# Patient Record
Sex: Female | Born: 1945
Health system: Southern US, Community
[De-identification: ages and names within clinical notes are randomized; demographics above are authoritative.]

## PROBLEM LIST (undated history)

## (undated) DIAGNOSIS — Z8711 Personal history of peptic ulcer disease: Secondary | ICD-10-CM

## (undated) DIAGNOSIS — M1711 Unilateral primary osteoarthritis, right knee: Secondary | ICD-10-CM

## (undated) DIAGNOSIS — R3915 Urgency of urination: Secondary | ICD-10-CM

## (undated) DIAGNOSIS — H409 Unspecified glaucoma: Secondary | ICD-10-CM

## (undated) DIAGNOSIS — Z86010 Personal history of colonic polyps: Secondary | ICD-10-CM

## (undated) DIAGNOSIS — Z8719 Personal history of other diseases of the digestive system: Secondary | ICD-10-CM

## (undated) DIAGNOSIS — K5790 Diverticulosis of intestine, part unspecified, without perforation or abscess without bleeding: Secondary | ICD-10-CM

## (undated) DIAGNOSIS — E785 Hyperlipidemia, unspecified: Secondary | ICD-10-CM

## (undated) DIAGNOSIS — E119 Type 2 diabetes mellitus without complications: Secondary | ICD-10-CM

## (undated) DIAGNOSIS — J45909 Unspecified asthma, uncomplicated: Secondary | ICD-10-CM

## (undated) DIAGNOSIS — H3530 Unspecified macular degeneration: Secondary | ICD-10-CM

## (undated) DIAGNOSIS — M2540 Effusion, unspecified joint: Secondary | ICD-10-CM

## (undated) DIAGNOSIS — E871 Hypo-osmolality and hyponatremia: Principal | ICD-10-CM

## (undated) DIAGNOSIS — C801 Malignant (primary) neoplasm, unspecified: Secondary | ICD-10-CM

## (undated) DIAGNOSIS — G4730 Sleep apnea, unspecified: Secondary | ICD-10-CM

## (undated) DIAGNOSIS — M2550 Pain in unspecified joint: Secondary | ICD-10-CM

## (undated) DIAGNOSIS — I10 Essential (primary) hypertension: Secondary | ICD-10-CM

## (undated) HISTORY — PX: JOINT REPLACEMENT: SHX530

## (undated) HISTORY — PX: COLONOSCOPY: SHX174

## (undated) HISTORY — DX: Type 2 diabetes mellitus without complications: E11.9

## (undated) HISTORY — PX: TUBAL LIGATION: SHX77

## (undated) HISTORY — PX: KNEE ARTHROSCOPY: SUR90

## (undated) HISTORY — DX: Hyperlipidemia, unspecified: E78.5

## (undated) HISTORY — DX: Sleep apnea, unspecified: G47.30

## (undated) HISTORY — DX: Essential (primary) hypertension: I10

## (undated) HISTORY — PX: OTHER SURGICAL HISTORY: SHX169

## (undated) HISTORY — PX: ESOPHAGOGASTRODUODENOSCOPY: SHX1529

## (undated) HISTORY — DX: Hypo-osmolality and hyponatremia: E87.1

## (undated) HISTORY — DX: Malignant (primary) neoplasm, unspecified: C80.1

---

## 1999-11-09 ENCOUNTER — Other Ambulatory Visit: Admission: RE | Admit: 1999-11-09 | Discharge: 1999-11-09 | Payer: Self-pay | Admitting: Gynecology

## 1999-11-10 ENCOUNTER — Other Ambulatory Visit: Admission: RE | Admit: 1999-11-10 | Discharge: 1999-11-10 | Payer: Self-pay | Admitting: Gynecology

## 1999-11-16 ENCOUNTER — Encounter: Payer: Self-pay | Admitting: Internal Medicine

## 1999-11-16 ENCOUNTER — Encounter: Admission: RE | Admit: 1999-11-16 | Discharge: 1999-11-16 | Payer: Self-pay | Admitting: Internal Medicine

## 2001-01-07 ENCOUNTER — Encounter: Admission: RE | Admit: 2001-01-07 | Discharge: 2001-01-07 | Payer: Self-pay | Admitting: Internal Medicine

## 2001-01-07 ENCOUNTER — Encounter: Payer: Self-pay | Admitting: Internal Medicine

## 2001-07-11 ENCOUNTER — Encounter: Admission: RE | Admit: 2001-07-11 | Discharge: 2001-07-11 | Payer: Self-pay | Admitting: Internal Medicine

## 2001-07-11 ENCOUNTER — Encounter: Payer: Self-pay | Admitting: Internal Medicine

## 2001-12-10 ENCOUNTER — Encounter (INDEPENDENT_AMBULATORY_CARE_PROVIDER_SITE_OTHER): Payer: Self-pay | Admitting: *Deleted

## 2001-12-10 ENCOUNTER — Ambulatory Visit (HOSPITAL_COMMUNITY): Admission: RE | Admit: 2001-12-10 | Discharge: 2001-12-10 | Payer: Self-pay | Admitting: Gastroenterology

## 2002-03-12 ENCOUNTER — Encounter: Payer: Self-pay | Admitting: Internal Medicine

## 2002-03-12 ENCOUNTER — Encounter: Admission: RE | Admit: 2002-03-12 | Discharge: 2002-03-12 | Payer: Self-pay | Admitting: Internal Medicine

## 2003-04-16 ENCOUNTER — Encounter: Payer: Self-pay | Admitting: Internal Medicine

## 2003-04-16 ENCOUNTER — Encounter: Admission: RE | Admit: 2003-04-16 | Discharge: 2003-04-16 | Payer: Self-pay | Admitting: Internal Medicine

## 2003-12-28 ENCOUNTER — Ambulatory Visit (HOSPITAL_COMMUNITY): Admission: RE | Admit: 2003-12-28 | Discharge: 2003-12-28 | Payer: Self-pay | Admitting: Orthopedic Surgery

## 2004-03-15 ENCOUNTER — Other Ambulatory Visit: Admission: RE | Admit: 2004-03-15 | Discharge: 2004-03-15 | Payer: Self-pay | Admitting: Internal Medicine

## 2004-08-22 ENCOUNTER — Encounter: Admission: RE | Admit: 2004-08-22 | Discharge: 2004-08-22 | Payer: Self-pay | Admitting: Internal Medicine

## 2005-02-21 ENCOUNTER — Ambulatory Visit (HOSPITAL_COMMUNITY): Admission: RE | Admit: 2005-02-21 | Discharge: 2005-02-21 | Payer: Self-pay | Admitting: Gastroenterology

## 2005-08-31 ENCOUNTER — Encounter: Admission: RE | Admit: 2005-08-31 | Discharge: 2005-08-31 | Payer: Self-pay | Admitting: Orthopedic Surgery

## 2005-09-18 ENCOUNTER — Encounter: Admission: RE | Admit: 2005-09-18 | Discharge: 2005-09-18 | Payer: Self-pay | Admitting: Internal Medicine

## 2005-11-06 ENCOUNTER — Ambulatory Visit (HOSPITAL_COMMUNITY): Admission: RE | Admit: 2005-11-06 | Discharge: 2005-11-06 | Payer: Self-pay | Admitting: Orthopedic Surgery

## 2005-11-06 ENCOUNTER — Ambulatory Visit (HOSPITAL_BASED_OUTPATIENT_CLINIC_OR_DEPARTMENT_OTHER): Admission: RE | Admit: 2005-11-06 | Discharge: 2005-11-06 | Payer: Self-pay | Admitting: Orthopedic Surgery

## 2006-05-15 ENCOUNTER — Other Ambulatory Visit: Admission: RE | Admit: 2006-05-15 | Discharge: 2006-05-15 | Payer: Self-pay | Admitting: Internal Medicine

## 2006-10-26 ENCOUNTER — Encounter: Admission: RE | Admit: 2006-10-26 | Discharge: 2006-10-26 | Payer: Self-pay | Admitting: Internal Medicine

## 2007-11-25 ENCOUNTER — Encounter: Admission: RE | Admit: 2007-11-25 | Discharge: 2007-11-25 | Payer: Self-pay | Admitting: Internal Medicine

## 2009-02-01 ENCOUNTER — Encounter: Admission: RE | Admit: 2009-02-01 | Discharge: 2009-02-01 | Payer: Self-pay | Admitting: Internal Medicine

## 2009-08-02 ENCOUNTER — Other Ambulatory Visit: Admission: RE | Admit: 2009-08-02 | Discharge: 2009-08-02 | Payer: Self-pay | Admitting: Family Medicine

## 2010-02-28 ENCOUNTER — Encounter: Admission: RE | Admit: 2010-02-28 | Discharge: 2010-02-28 | Payer: Self-pay | Admitting: Internal Medicine

## 2011-04-03 ENCOUNTER — Other Ambulatory Visit: Payer: Self-pay | Admitting: Internal Medicine

## 2011-04-03 DIAGNOSIS — Z1231 Encounter for screening mammogram for malignant neoplasm of breast: Secondary | ICD-10-CM

## 2011-04-05 ENCOUNTER — Ambulatory Visit
Admission: RE | Admit: 2011-04-05 | Discharge: 2011-04-05 | Disposition: A | Discharge status: Home / Self Care | Payer: Medicare Other | Source: Ambulatory Visit | Attending: Internal Medicine | Admitting: Internal Medicine

## 2011-04-05 DIAGNOSIS — Z1231 Encounter for screening mammogram for malignant neoplasm of breast: Secondary | ICD-10-CM

## 2011-05-12 NOTE — Op Note (Signed)
NAME:  Natalie Riddle, Natalie Riddle        ACCOUNT NO.:  [id number]   MEDICAL RECORD NO.:  [id number]          PATIENT TYPE:  AMB   LOCATION:  DSC                          FACILITY:  [facility]   PHYSICIAN:  Robert A. Thurston Hole, M.D. DATE OF BIRTH:  Jan 02, 1946   DATE OF PROCEDURE:  11/06/2005  DATE OF DISCHARGE:                                 OPERATIVE REPORT   PREOPERATIVE DIAGNOSIS:  Right knee medial and lateral meniscus tears with  chondromalacia and synovitis.   POSTOPERATIVE DIAGNOSIS:  Right knee medial and lateral meniscus tears with  chondromalacia and synovitis.   PROCEDURE:  1.  Right knee exam under anesthesia followed by arthroscopic partial medial      and lateral meniscectomies.  2.  Right knee chondroplasty with partial synovectomy.   SURGEON:  Elana Alm. Thurston Hole, M.D.   ANESTHESIA:  General.   OPERATIVE TIME:  30 minutes.   COMPLICATIONS:  None.   INDICATIONS FOR PROCEDURE:  Natalie Riddle is a 65 year old woman who has  had significant right knee pain for the past 3-4 months increasing in nature  with exam and MRI documenting medial and lateral meniscus tears with  chondromalacia and synovitis.  She has failed conservative care and is now  to undergo arthroscopy.   DESCRIPTION OF PROCEDURE:  Natalie Riddle was brought to the operating room  on November 06, 2005, after a knee block had been placed in the holding room  by anesthesia.  She was placed on the operating table in supine position.  Her right knee was examined.  Range of motion from 0 to 125 degrees, 1-2+  crepitation, stable to ligamentous exam with normal patellar tracking.  The  right leg was prepped with DuraPrep and draped using sterile technique.  Originally through an anterolateral portal, the arthroscope with a pump  attachment was placed and through an anteromedial portal, an arthroscopic  probe was placed.  On initial inspection of the medial compartment, she was  found to have 75-80% grade 3  chondromalacia which was debrided.  Medial  meniscus tear posterior medial horn of which 50% was resected back to a  stable rim.  The intercondylar notch was inspected and the anterior and  posterior cruciate ligaments were normal.  The lateral compartment was  inspected, 30-40% grade 3 chondromalacia which was debrided.  Lateral  meniscus tear posterolateral and anterior horn of which 30% was resected  back to a stable rim.  The patellofemoral joint showed 75% grade 3  chondromalacia which was debrided.  The patella tracked normally.  Significant synovitis in the medial and lateral gutters were debrided,  otherwise, they were free of pathology.  After this was done, it was felt  that all pathology had been satisfactorily addressed.  The instruments were  removed.  The portals were closed with a 3-0 nylon suture and injected with  0.25% Marcaine with epinephrine and 4 mg morphine.  Sterile dressings were  applied.  The patient was awakened and taken to the recovery room in stable  condition.   FOLLOW UP:  Natalie Riddle will be followed as an outpatient on [employer], Darvocet, and Celebrex.  See her  back in the office in one week for  sutures out and follow up.      Robert A. Thurston Hole, M.D.  Electronically Signed     RAW/MEDQ  D:  11/06/2005  T:  11/06/2005  Job:  [id number]

## 2011-12-27 DIAGNOSIS — IMO0002 Reserved for concepts with insufficient information to code with codable children: Secondary | ICD-10-CM | POA: Diagnosis not present

## 2011-12-27 DIAGNOSIS — M7660 Achilles tendinitis, unspecified leg: Secondary | ICD-10-CM | POA: Diagnosis not present

## 2011-12-28 DIAGNOSIS — M1710 Unilateral primary osteoarthritis, unspecified knee: Secondary | ICD-10-CM | POA: Diagnosis not present

## 2012-01-18 DIAGNOSIS — IMO0002 Reserved for concepts with insufficient information to code with codable children: Secondary | ICD-10-CM | POA: Diagnosis not present

## 2012-01-18 DIAGNOSIS — M1710 Unilateral primary osteoarthritis, unspecified knee: Secondary | ICD-10-CM | POA: Diagnosis not present

## 2012-01-18 DIAGNOSIS — M7660 Achilles tendinitis, unspecified leg: Secondary | ICD-10-CM | POA: Diagnosis not present

## 2012-01-25 DIAGNOSIS — M1710 Unilateral primary osteoarthritis, unspecified knee: Secondary | ICD-10-CM | POA: Diagnosis not present

## 2012-01-26 DIAGNOSIS — IMO0002 Reserved for concepts with insufficient information to code with codable children: Secondary | ICD-10-CM | POA: Diagnosis not present

## 2012-01-26 DIAGNOSIS — M7660 Achilles tendinitis, unspecified leg: Secondary | ICD-10-CM | POA: Diagnosis not present

## 2012-01-29 DIAGNOSIS — J40 Bronchitis, not specified as acute or chronic: Secondary | ICD-10-CM | POA: Diagnosis not present

## 2012-01-29 DIAGNOSIS — E785 Hyperlipidemia, unspecified: Secondary | ICD-10-CM | POA: Diagnosis not present

## 2012-01-29 DIAGNOSIS — I10 Essential (primary) hypertension: Secondary | ICD-10-CM | POA: Diagnosis not present

## 2012-01-29 DIAGNOSIS — R7301 Impaired fasting glucose: Secondary | ICD-10-CM | POA: Diagnosis not present

## 2012-01-31 DIAGNOSIS — M7660 Achilles tendinitis, unspecified leg: Secondary | ICD-10-CM | POA: Diagnosis not present

## 2012-01-31 DIAGNOSIS — IMO0002 Reserved for concepts with insufficient information to code with codable children: Secondary | ICD-10-CM | POA: Diagnosis not present

## 2012-02-01 DIAGNOSIS — M1710 Unilateral primary osteoarthritis, unspecified knee: Secondary | ICD-10-CM | POA: Diagnosis not present

## 2012-02-05 DIAGNOSIS — IMO0002 Reserved for concepts with insufficient information to code with codable children: Secondary | ICD-10-CM | POA: Diagnosis not present

## 2012-02-05 DIAGNOSIS — M7660 Achilles tendinitis, unspecified leg: Secondary | ICD-10-CM | POA: Diagnosis not present

## 2012-02-07 DIAGNOSIS — M7660 Achilles tendinitis, unspecified leg: Secondary | ICD-10-CM | POA: Diagnosis not present

## 2012-02-07 DIAGNOSIS — IMO0002 Reserved for concepts with insufficient information to code with codable children: Secondary | ICD-10-CM | POA: Diagnosis not present

## 2012-02-13 DIAGNOSIS — IMO0002 Reserved for concepts with insufficient information to code with codable children: Secondary | ICD-10-CM | POA: Diagnosis not present

## 2012-02-13 DIAGNOSIS — M7660 Achilles tendinitis, unspecified leg: Secondary | ICD-10-CM | POA: Diagnosis not present

## 2012-02-15 DIAGNOSIS — M7660 Achilles tendinitis, unspecified leg: Secondary | ICD-10-CM | POA: Diagnosis not present

## 2012-02-15 DIAGNOSIS — IMO0002 Reserved for concepts with insufficient information to code with codable children: Secondary | ICD-10-CM | POA: Diagnosis not present

## 2012-02-19 DIAGNOSIS — IMO0002 Reserved for concepts with insufficient information to code with codable children: Secondary | ICD-10-CM | POA: Diagnosis not present

## 2012-02-19 DIAGNOSIS — M7660 Achilles tendinitis, unspecified leg: Secondary | ICD-10-CM | POA: Diagnosis not present

## 2012-02-27 DIAGNOSIS — IMO0002 Reserved for concepts with insufficient information to code with codable children: Secondary | ICD-10-CM | POA: Diagnosis not present

## 2012-02-27 DIAGNOSIS — M7660 Achilles tendinitis, unspecified leg: Secondary | ICD-10-CM | POA: Diagnosis not present

## 2012-02-28 DIAGNOSIS — IMO0002 Reserved for concepts with insufficient information to code with codable children: Secondary | ICD-10-CM | POA: Diagnosis not present

## 2012-02-28 DIAGNOSIS — M7660 Achilles tendinitis, unspecified leg: Secondary | ICD-10-CM | POA: Diagnosis not present

## 2012-03-05 DIAGNOSIS — M7660 Achilles tendinitis, unspecified leg: Secondary | ICD-10-CM | POA: Diagnosis not present

## 2012-03-05 DIAGNOSIS — IMO0002 Reserved for concepts with insufficient information to code with codable children: Secondary | ICD-10-CM | POA: Diagnosis not present

## 2012-03-11 DIAGNOSIS — IMO0002 Reserved for concepts with insufficient information to code with codable children: Secondary | ICD-10-CM | POA: Diagnosis not present

## 2012-03-11 DIAGNOSIS — M7660 Achilles tendinitis, unspecified leg: Secondary | ICD-10-CM | POA: Diagnosis not present

## 2012-03-26 DIAGNOSIS — M25569 Pain in unspecified knee: Secondary | ICD-10-CM | POA: Diagnosis not present

## 2012-03-26 DIAGNOSIS — M1710 Unilateral primary osteoarthritis, unspecified knee: Secondary | ICD-10-CM | POA: Diagnosis not present

## 2012-03-30 DIAGNOSIS — IMO0002 Reserved for concepts with insufficient information to code with codable children: Secondary | ICD-10-CM | POA: Diagnosis not present

## 2012-03-30 DIAGNOSIS — M7660 Achilles tendinitis, unspecified leg: Secondary | ICD-10-CM | POA: Diagnosis not present

## 2012-04-30 DIAGNOSIS — R05 Cough: Secondary | ICD-10-CM | POA: Diagnosis not present

## 2012-04-30 DIAGNOSIS — J209 Acute bronchitis, unspecified: Secondary | ICD-10-CM | POA: Diagnosis not present

## 2012-05-13 DIAGNOSIS — H43819 Vitreous degeneration, unspecified eye: Secondary | ICD-10-CM | POA: Diagnosis not present

## 2012-05-13 DIAGNOSIS — H409 Unspecified glaucoma: Secondary | ICD-10-CM | POA: Diagnosis not present

## 2012-05-13 DIAGNOSIS — H3530 Unspecified macular degeneration: Secondary | ICD-10-CM | POA: Diagnosis not present

## 2012-05-13 DIAGNOSIS — H4011X0 Primary open-angle glaucoma, stage unspecified: Secondary | ICD-10-CM | POA: Diagnosis not present

## 2012-07-05 ENCOUNTER — Other Ambulatory Visit: Payer: Self-pay | Admitting: Internal Medicine

## 2012-07-05 DIAGNOSIS — Z1231 Encounter for screening mammogram for malignant neoplasm of breast: Secondary | ICD-10-CM

## 2012-07-09 ENCOUNTER — Ambulatory Visit
Admission: RE | Admit: 2012-07-09 | Discharge: 2012-07-09 | Disposition: A | Discharge status: Home / Self Care | Payer: Medicare Other | Source: Ambulatory Visit | Attending: Internal Medicine | Admitting: Internal Medicine

## 2012-07-09 DIAGNOSIS — Z1231 Encounter for screening mammogram for malignant neoplasm of breast: Secondary | ICD-10-CM | POA: Diagnosis not present

## 2012-07-11 DIAGNOSIS — M542 Cervicalgia: Secondary | ICD-10-CM | POA: Diagnosis not present

## 2012-07-11 DIAGNOSIS — M1710 Unilateral primary osteoarthritis, unspecified knee: Secondary | ICD-10-CM | POA: Diagnosis not present

## 2012-07-29 DIAGNOSIS — Z0000 Encounter for general adult medical examination without abnormal findings: Secondary | ICD-10-CM | POA: Diagnosis not present

## 2012-08-14 DIAGNOSIS — R7309 Other abnormal glucose: Secondary | ICD-10-CM | POA: Diagnosis not present

## 2012-08-27 DIAGNOSIS — IMO0002 Reserved for concepts with insufficient information to code with codable children: Secondary | ICD-10-CM | POA: Diagnosis not present

## 2012-08-27 DIAGNOSIS — M7660 Achilles tendinitis, unspecified leg: Secondary | ICD-10-CM | POA: Diagnosis not present

## 2012-08-30 DIAGNOSIS — M7660 Achilles tendinitis, unspecified leg: Secondary | ICD-10-CM | POA: Diagnosis not present

## 2012-08-30 DIAGNOSIS — IMO0002 Reserved for concepts with insufficient information to code with codable children: Secondary | ICD-10-CM | POA: Diagnosis not present

## 2012-09-11 DIAGNOSIS — H259 Unspecified age-related cataract: Secondary | ICD-10-CM | POA: Diagnosis not present

## 2012-09-11 DIAGNOSIS — H409 Unspecified glaucoma: Secondary | ICD-10-CM | POA: Diagnosis not present

## 2012-09-11 DIAGNOSIS — H4011X0 Primary open-angle glaucoma, stage unspecified: Secondary | ICD-10-CM | POA: Diagnosis not present

## 2012-10-09 DIAGNOSIS — M25569 Pain in unspecified knee: Secondary | ICD-10-CM | POA: Diagnosis not present

## 2012-10-09 DIAGNOSIS — M542 Cervicalgia: Secondary | ICD-10-CM | POA: Diagnosis not present

## 2012-10-09 DIAGNOSIS — M1710 Unilateral primary osteoarthritis, unspecified knee: Secondary | ICD-10-CM | POA: Diagnosis not present

## 2012-10-22 DIAGNOSIS — Z23 Encounter for immunization: Secondary | ICD-10-CM | POA: Diagnosis not present

## 2012-10-23 DIAGNOSIS — M1710 Unilateral primary osteoarthritis, unspecified knee: Secondary | ICD-10-CM | POA: Diagnosis not present

## 2012-11-04 DIAGNOSIS — M1710 Unilateral primary osteoarthritis, unspecified knee: Secondary | ICD-10-CM | POA: Diagnosis not present

## 2012-11-12 DIAGNOSIS — M1710 Unilateral primary osteoarthritis, unspecified knee: Secondary | ICD-10-CM | POA: Diagnosis not present

## 2013-01-24 DIAGNOSIS — IMO0002 Reserved for concepts with insufficient information to code with codable children: Secondary | ICD-10-CM | POA: Diagnosis not present

## 2013-01-24 DIAGNOSIS — M1710 Unilateral primary osteoarthritis, unspecified knee: Secondary | ICD-10-CM | POA: Diagnosis not present

## 2013-01-28 DIAGNOSIS — M1710 Unilateral primary osteoarthritis, unspecified knee: Secondary | ICD-10-CM | POA: Diagnosis not present

## 2013-01-28 DIAGNOSIS — IMO0002 Reserved for concepts with insufficient information to code with codable children: Secondary | ICD-10-CM | POA: Diagnosis not present

## 2013-01-30 DIAGNOSIS — IMO0002 Reserved for concepts with insufficient information to code with codable children: Secondary | ICD-10-CM | POA: Diagnosis not present

## 2013-01-30 DIAGNOSIS — M1710 Unilateral primary osteoarthritis, unspecified knee: Secondary | ICD-10-CM | POA: Diagnosis not present

## 2013-02-10 DIAGNOSIS — M1710 Unilateral primary osteoarthritis, unspecified knee: Secondary | ICD-10-CM | POA: Diagnosis not present

## 2013-02-10 DIAGNOSIS — IMO0002 Reserved for concepts with insufficient information to code with codable children: Secondary | ICD-10-CM | POA: Diagnosis not present

## 2013-02-17 DIAGNOSIS — IMO0002 Reserved for concepts with insufficient information to code with codable children: Secondary | ICD-10-CM | POA: Diagnosis not present

## 2013-03-12 DIAGNOSIS — M1710 Unilateral primary osteoarthritis, unspecified knee: Secondary | ICD-10-CM | POA: Diagnosis not present

## 2013-03-14 DIAGNOSIS — IMO0002 Reserved for concepts with insufficient information to code with codable children: Secondary | ICD-10-CM | POA: Diagnosis not present

## 2013-03-14 DIAGNOSIS — M1710 Unilateral primary osteoarthritis, unspecified knee: Secondary | ICD-10-CM | POA: Diagnosis not present

## 2013-03-20 DIAGNOSIS — IMO0002 Reserved for concepts with insufficient information to code with codable children: Secondary | ICD-10-CM | POA: Diagnosis not present

## 2013-03-20 DIAGNOSIS — M1710 Unilateral primary osteoarthritis, unspecified knee: Secondary | ICD-10-CM | POA: Diagnosis not present

## 2013-03-24 DIAGNOSIS — M1710 Unilateral primary osteoarthritis, unspecified knee: Secondary | ICD-10-CM | POA: Diagnosis not present

## 2013-03-24 DIAGNOSIS — IMO0002 Reserved for concepts with insufficient information to code with codable children: Secondary | ICD-10-CM | POA: Diagnosis not present

## 2013-03-28 DIAGNOSIS — IMO0002 Reserved for concepts with insufficient information to code with codable children: Secondary | ICD-10-CM | POA: Diagnosis not present

## 2013-03-28 DIAGNOSIS — M1710 Unilateral primary osteoarthritis, unspecified knee: Secondary | ICD-10-CM | POA: Diagnosis not present

## 2013-04-09 DIAGNOSIS — M1710 Unilateral primary osteoarthritis, unspecified knee: Secondary | ICD-10-CM | POA: Diagnosis not present

## 2013-04-09 DIAGNOSIS — IMO0002 Reserved for concepts with insufficient information to code with codable children: Secondary | ICD-10-CM | POA: Diagnosis not present

## 2013-04-10 DIAGNOSIS — H409 Unspecified glaucoma: Secondary | ICD-10-CM | POA: Diagnosis not present

## 2013-04-10 DIAGNOSIS — H4011X0 Primary open-angle glaucoma, stage unspecified: Secondary | ICD-10-CM | POA: Diagnosis not present

## 2013-04-23 DIAGNOSIS — M1710 Unilateral primary osteoarthritis, unspecified knee: Secondary | ICD-10-CM | POA: Diagnosis not present

## 2013-04-23 DIAGNOSIS — IMO0002 Reserved for concepts with insufficient information to code with codable children: Secondary | ICD-10-CM | POA: Diagnosis not present

## 2013-05-05 DIAGNOSIS — H16109 Unspecified superficial keratitis, unspecified eye: Secondary | ICD-10-CM | POA: Diagnosis not present

## 2013-05-05 DIAGNOSIS — M1710 Unilateral primary osteoarthritis, unspecified knee: Secondary | ICD-10-CM | POA: Diagnosis not present

## 2013-05-05 DIAGNOSIS — H409 Unspecified glaucoma: Secondary | ICD-10-CM | POA: Diagnosis not present

## 2013-05-05 DIAGNOSIS — IMO0002 Reserved for concepts with insufficient information to code with codable children: Secondary | ICD-10-CM | POA: Diagnosis not present

## 2013-05-05 DIAGNOSIS — H4011X0 Primary open-angle glaucoma, stage unspecified: Secondary | ICD-10-CM | POA: Diagnosis not present

## 2013-05-05 DIAGNOSIS — H2510 Age-related nuclear cataract, unspecified eye: Secondary | ICD-10-CM | POA: Diagnosis not present

## 2013-05-08 DIAGNOSIS — M1710 Unilateral primary osteoarthritis, unspecified knee: Secondary | ICD-10-CM | POA: Diagnosis not present

## 2013-05-08 DIAGNOSIS — IMO0002 Reserved for concepts with insufficient information to code with codable children: Secondary | ICD-10-CM | POA: Diagnosis not present

## 2013-05-12 DIAGNOSIS — M1710 Unilateral primary osteoarthritis, unspecified knee: Secondary | ICD-10-CM | POA: Diagnosis not present

## 2013-05-12 DIAGNOSIS — IMO0002 Reserved for concepts with insufficient information to code with codable children: Secondary | ICD-10-CM | POA: Diagnosis not present

## 2013-05-12 DIAGNOSIS — H1820 Unspecified corneal edema: Secondary | ICD-10-CM | POA: Diagnosis not present

## 2013-05-26 DIAGNOSIS — E785 Hyperlipidemia, unspecified: Secondary | ICD-10-CM | POA: Diagnosis not present

## 2013-05-26 DIAGNOSIS — E669 Obesity, unspecified: Secondary | ICD-10-CM | POA: Diagnosis not present

## 2013-05-26 DIAGNOSIS — I10 Essential (primary) hypertension: Secondary | ICD-10-CM | POA: Diagnosis not present

## 2013-05-26 DIAGNOSIS — R7301 Impaired fasting glucose: Secondary | ICD-10-CM | POA: Diagnosis not present

## 2013-05-26 DIAGNOSIS — M1710 Unilateral primary osteoarthritis, unspecified knee: Secondary | ICD-10-CM | POA: Diagnosis not present

## 2013-07-10 DIAGNOSIS — H4011X0 Primary open-angle glaucoma, stage unspecified: Secondary | ICD-10-CM | POA: Diagnosis not present

## 2013-07-10 DIAGNOSIS — H04129 Dry eye syndrome of unspecified lacrimal gland: Secondary | ICD-10-CM | POA: Diagnosis not present

## 2013-07-10 DIAGNOSIS — H409 Unspecified glaucoma: Secondary | ICD-10-CM | POA: Diagnosis not present

## 2013-07-10 DIAGNOSIS — H02059 Trichiasis without entropian unspecified eye, unspecified eyelid: Secondary | ICD-10-CM | POA: Diagnosis not present

## 2013-09-02 ENCOUNTER — Other Ambulatory Visit: Payer: Self-pay

## 2013-09-02 DIAGNOSIS — Z1231 Encounter for screening mammogram for malignant neoplasm of breast: Secondary | ICD-10-CM

## 2013-09-08 DIAGNOSIS — T887XXA Unspecified adverse effect of drug or medicament, initial encounter: Secondary | ICD-10-CM | POA: Diagnosis not present

## 2013-09-08 DIAGNOSIS — H4011X0 Primary open-angle glaucoma, stage unspecified: Secondary | ICD-10-CM | POA: Diagnosis not present

## 2013-09-08 DIAGNOSIS — H409 Unspecified glaucoma: Secondary | ICD-10-CM | POA: Diagnosis not present

## 2013-09-08 DIAGNOSIS — H1010 Acute atopic conjunctivitis, unspecified eye: Secondary | ICD-10-CM | POA: Diagnosis not present

## 2013-09-22 DIAGNOSIS — Z23 Encounter for immunization: Secondary | ICD-10-CM | POA: Diagnosis not present

## 2013-10-15 ENCOUNTER — Ambulatory Visit: Payer: Medicare Other

## 2013-10-17 DIAGNOSIS — H02059 Trichiasis without entropian unspecified eye, unspecified eyelid: Secondary | ICD-10-CM | POA: Diagnosis not present

## 2013-10-17 DIAGNOSIS — T887XXA Unspecified adverse effect of drug or medicament, initial encounter: Secondary | ICD-10-CM | POA: Diagnosis not present

## 2013-10-17 DIAGNOSIS — H4011X0 Primary open-angle glaucoma, stage unspecified: Secondary | ICD-10-CM | POA: Diagnosis not present

## 2013-10-17 DIAGNOSIS — H409 Unspecified glaucoma: Secondary | ICD-10-CM | POA: Diagnosis not present

## 2013-10-20 ENCOUNTER — Ambulatory Visit
Admission: RE | Admit: 2013-10-20 | Discharge: 2013-10-20 | Disposition: A | Discharge status: Home / Self Care | Payer: Medicare Other | Source: Ambulatory Visit

## 2013-10-20 DIAGNOSIS — Z1231 Encounter for screening mammogram for malignant neoplasm of breast: Secondary | ICD-10-CM

## 2013-11-17 DIAGNOSIS — H04129 Dry eye syndrome of unspecified lacrimal gland: Secondary | ICD-10-CM | POA: Diagnosis not present

## 2013-11-17 DIAGNOSIS — H4011X0 Primary open-angle glaucoma, stage unspecified: Secondary | ICD-10-CM | POA: Diagnosis not present

## 2013-11-17 DIAGNOSIS — H16219 Exposure keratoconjunctivitis, unspecified eye: Secondary | ICD-10-CM | POA: Diagnosis not present

## 2013-12-16 DIAGNOSIS — I10 Essential (primary) hypertension: Secondary | ICD-10-CM | POA: Diagnosis not present

## 2013-12-16 DIAGNOSIS — E119 Type 2 diabetes mellitus without complications: Secondary | ICD-10-CM | POA: Diagnosis not present

## 2013-12-16 DIAGNOSIS — M542 Cervicalgia: Secondary | ICD-10-CM | POA: Diagnosis not present

## 2013-12-16 IMAGING — MG MM SCREEN MAMMOGRAM BILATERAL
6 series · 6 of 6 positions shown · non-contrast
Comparison: Previous exam(s).

CLINICAL DATA: Screening.

EXAM:
DIGITAL SCREENING BILATERAL MAMMOGRAM WITH CAD

[R CC]
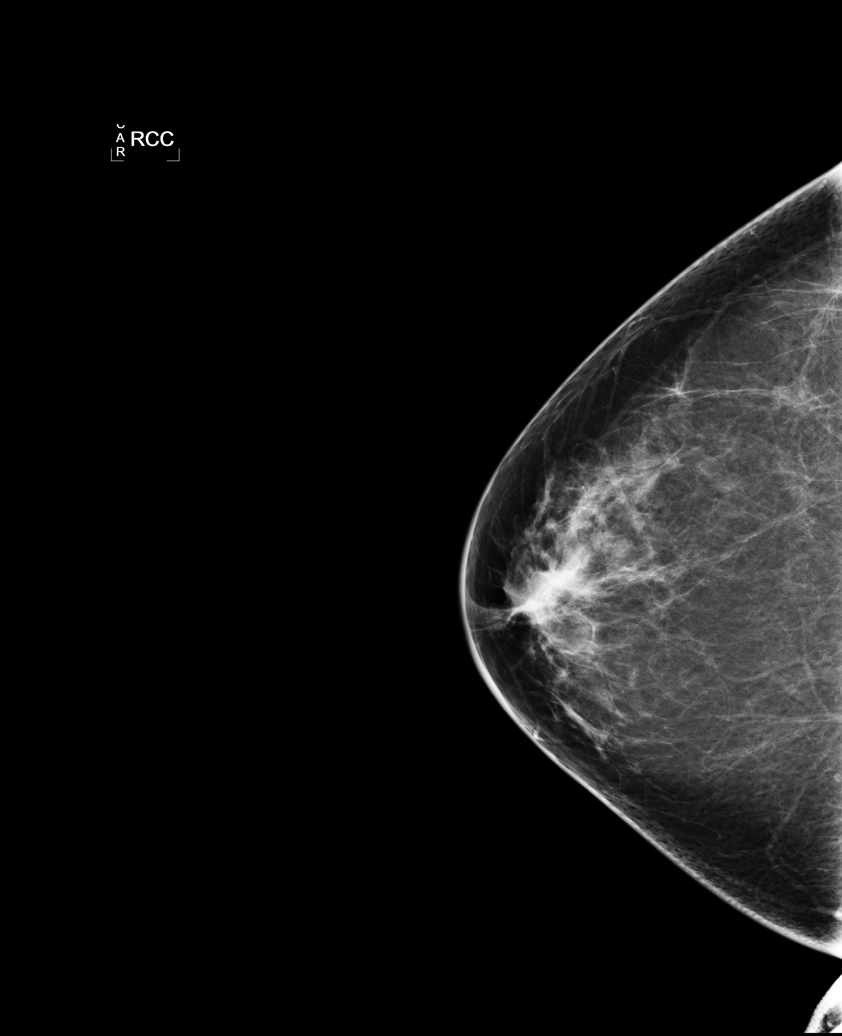

[L CC]
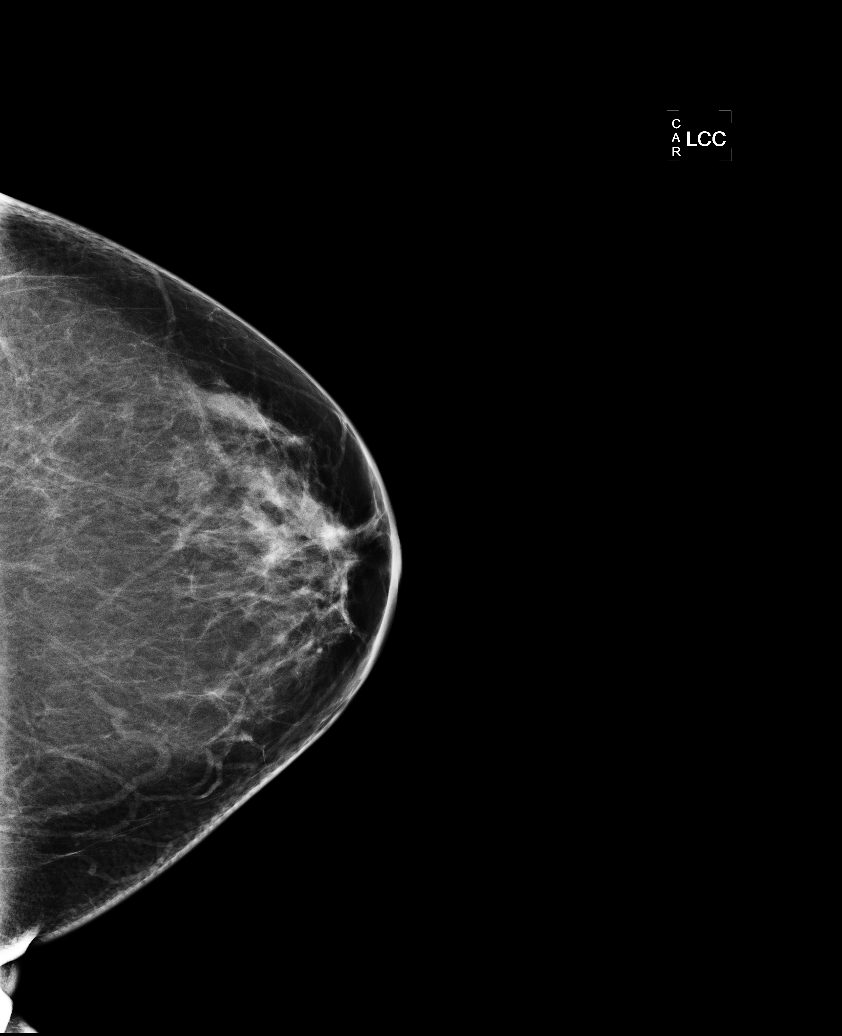

[L MLO (1 of 2)]
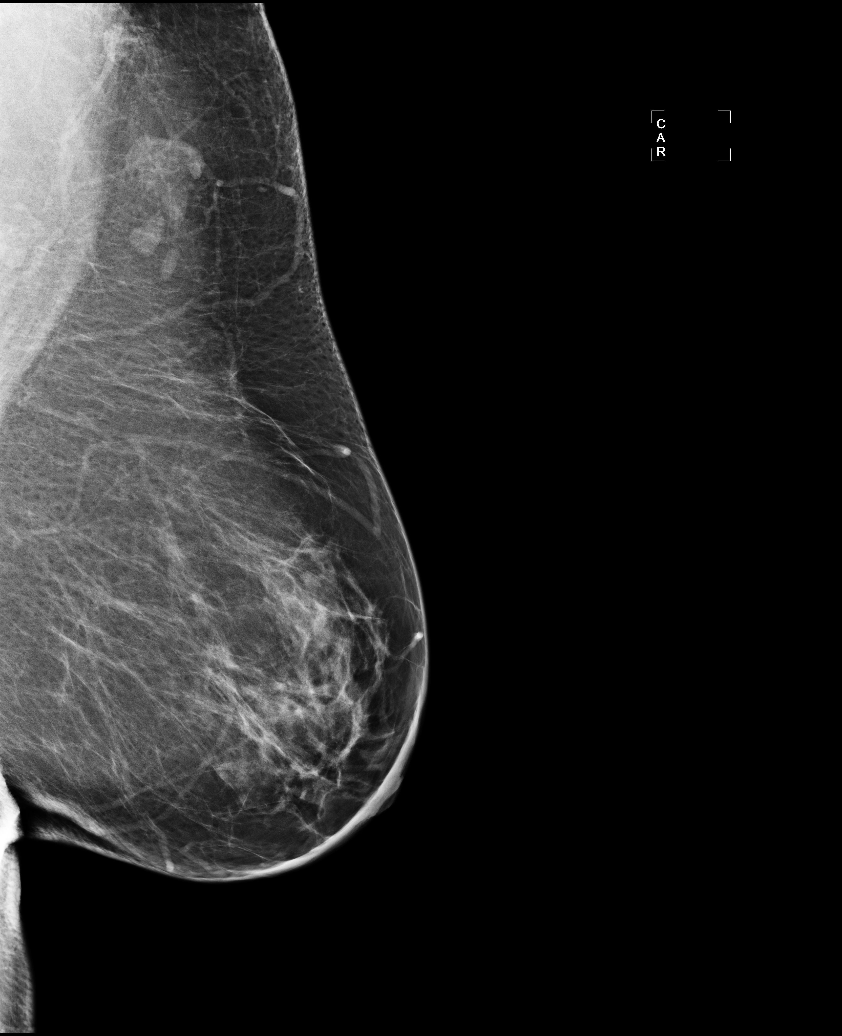

[R MLO (1 of 2)]
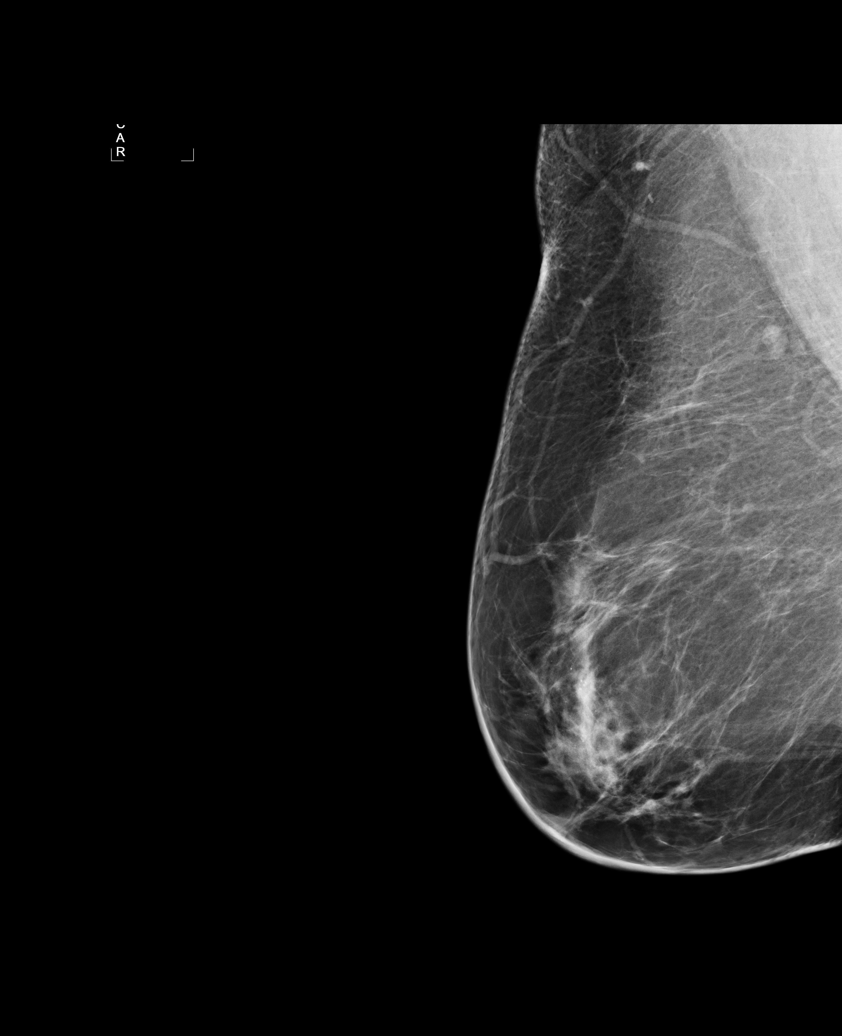

[L MLO (2 of 2)]
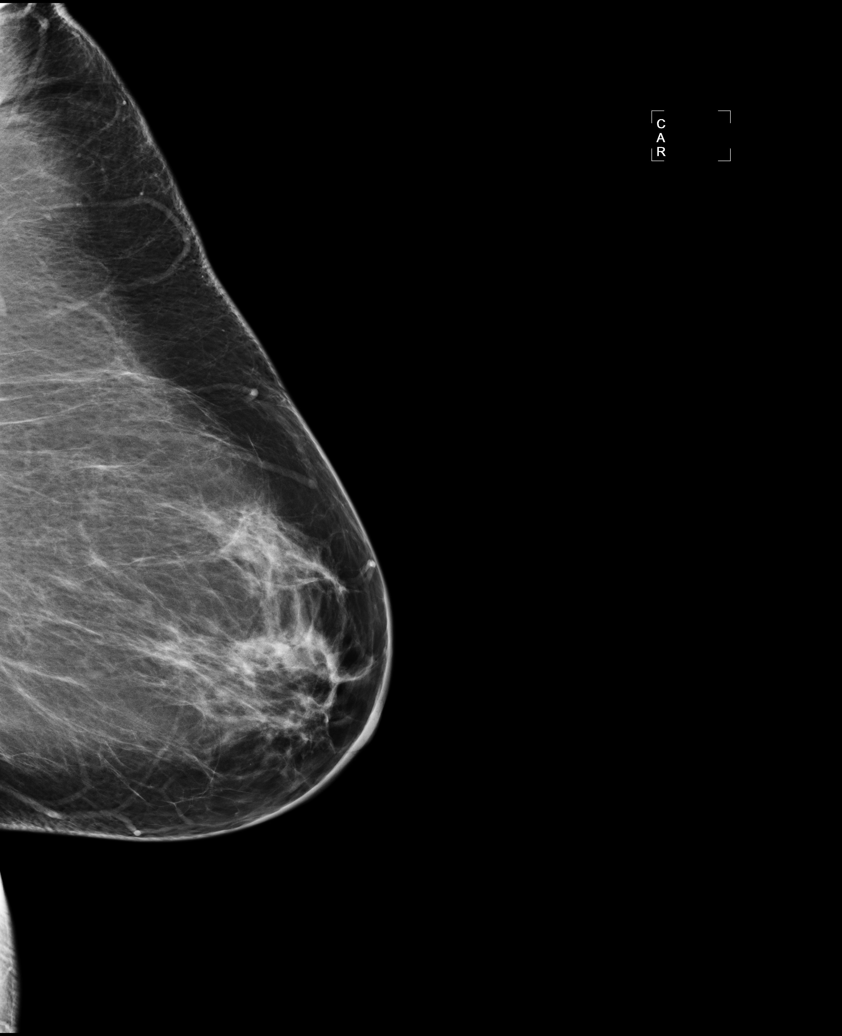

[R MLO (2 of 2)]
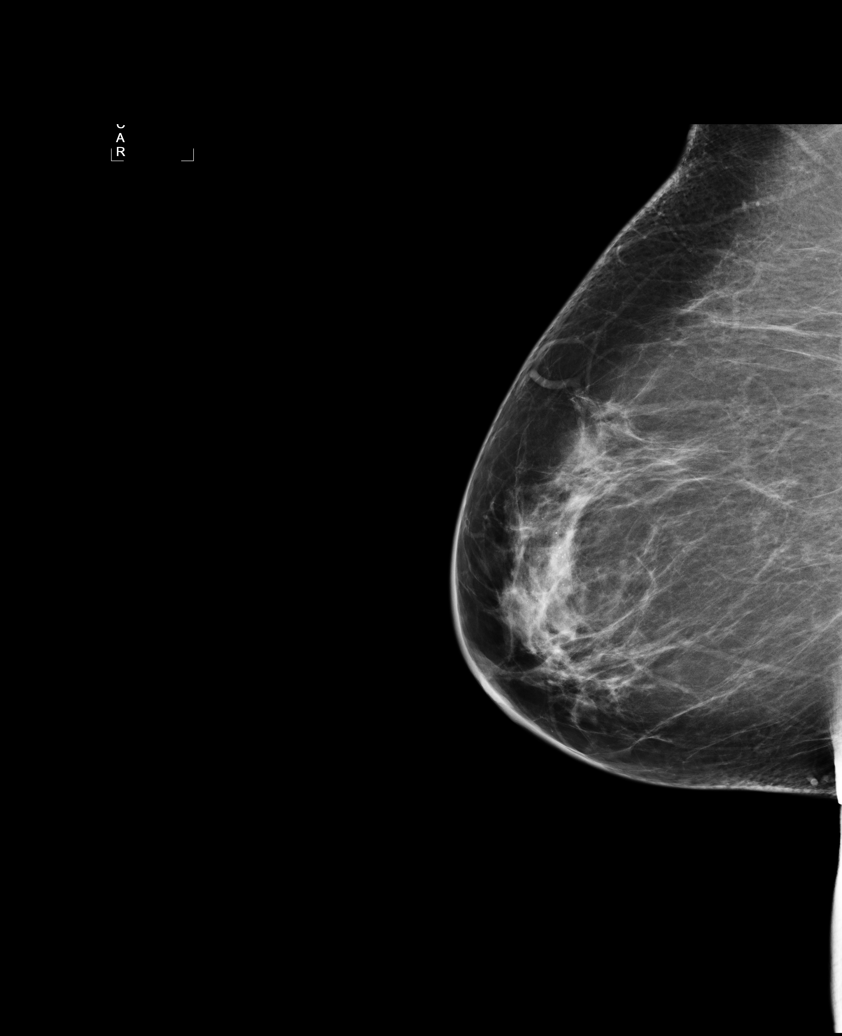

[6 of 6 positions shown; findings below may reference images not displayed]

ACR Breast Density Category b: There are scattered areas of
fibroglandular density.
FINDINGS: There are no findings suspicious for malignancy. Images were
processed with CAD.
IMPRESSION: No mammographic evidence of malignancy. A result letter of this
screening mammogram will be mailed directly to the patient.

RECOMMENDATION:
Screening mammogram in one year. (Code:[id number])

BI-RADS CATEGORY  1: Negative

## 2013-12-23 DIAGNOSIS — S43499A Other sprain of unspecified shoulder joint, initial encounter: Secondary | ICD-10-CM | POA: Diagnosis not present

## 2013-12-23 DIAGNOSIS — M62838 Other muscle spasm: Secondary | ICD-10-CM | POA: Diagnosis not present

## 2013-12-29 DIAGNOSIS — M62838 Other muscle spasm: Secondary | ICD-10-CM | POA: Diagnosis not present

## 2013-12-29 DIAGNOSIS — S43499A Other sprain of unspecified shoulder joint, initial encounter: Secondary | ICD-10-CM | POA: Diagnosis not present

## 2013-12-29 DIAGNOSIS — S46819A Strain of other muscles, fascia and tendons at shoulder and upper arm level, unspecified arm, initial encounter: Secondary | ICD-10-CM | POA: Diagnosis not present

## 2014-01-01 DIAGNOSIS — M62838 Other muscle spasm: Secondary | ICD-10-CM | POA: Diagnosis not present

## 2014-01-01 DIAGNOSIS — S46819A Strain of other muscles, fascia and tendons at shoulder and upper arm level, unspecified arm, initial encounter: Secondary | ICD-10-CM | POA: Diagnosis not present

## 2014-01-01 DIAGNOSIS — S43499A Other sprain of unspecified shoulder joint, initial encounter: Secondary | ICD-10-CM | POA: Diagnosis not present

## 2014-01-09 DIAGNOSIS — M62838 Other muscle spasm: Secondary | ICD-10-CM | POA: Diagnosis not present

## 2014-01-09 DIAGNOSIS — S46819A Strain of other muscles, fascia and tendons at shoulder and upper arm level, unspecified arm, initial encounter: Secondary | ICD-10-CM | POA: Diagnosis not present

## 2014-01-09 DIAGNOSIS — S43499A Other sprain of unspecified shoulder joint, initial encounter: Secondary | ICD-10-CM | POA: Diagnosis not present

## 2014-01-12 DIAGNOSIS — S43499A Other sprain of unspecified shoulder joint, initial encounter: Secondary | ICD-10-CM | POA: Diagnosis not present

## 2014-01-12 DIAGNOSIS — S46819A Strain of other muscles, fascia and tendons at shoulder and upper arm level, unspecified arm, initial encounter: Secondary | ICD-10-CM | POA: Diagnosis not present

## 2014-01-12 DIAGNOSIS — M62838 Other muscle spasm: Secondary | ICD-10-CM | POA: Diagnosis not present

## 2014-01-22 DIAGNOSIS — S43499A Other sprain of unspecified shoulder joint, initial encounter: Secondary | ICD-10-CM | POA: Diagnosis not present

## 2014-01-22 DIAGNOSIS — S46819A Strain of other muscles, fascia and tendons at shoulder and upper arm level, unspecified arm, initial encounter: Secondary | ICD-10-CM | POA: Diagnosis not present

## 2014-01-22 DIAGNOSIS — M62838 Other muscle spasm: Secondary | ICD-10-CM | POA: Diagnosis not present

## 2014-02-02 DIAGNOSIS — H259 Unspecified age-related cataract: Secondary | ICD-10-CM | POA: Diagnosis not present

## 2014-02-02 DIAGNOSIS — H409 Unspecified glaucoma: Secondary | ICD-10-CM | POA: Diagnosis not present

## 2014-02-02 DIAGNOSIS — E119 Type 2 diabetes mellitus without complications: Secondary | ICD-10-CM | POA: Diagnosis not present

## 2014-02-02 DIAGNOSIS — H4011X0 Primary open-angle glaucoma, stage unspecified: Secondary | ICD-10-CM | POA: Diagnosis not present

## 2014-02-05 DIAGNOSIS — M62838 Other muscle spasm: Secondary | ICD-10-CM | POA: Diagnosis not present

## 2014-02-05 DIAGNOSIS — S43499A Other sprain of unspecified shoulder joint, initial encounter: Secondary | ICD-10-CM | POA: Diagnosis not present

## 2014-02-05 DIAGNOSIS — S46819A Strain of other muscles, fascia and tendons at shoulder and upper arm level, unspecified arm, initial encounter: Secondary | ICD-10-CM | POA: Diagnosis not present

## 2014-02-09 DIAGNOSIS — S43499A Other sprain of unspecified shoulder joint, initial encounter: Secondary | ICD-10-CM | POA: Diagnosis not present

## 2014-02-09 DIAGNOSIS — M62838 Other muscle spasm: Secondary | ICD-10-CM | POA: Diagnosis not present

## 2014-02-13 DIAGNOSIS — S46819A Strain of other muscles, fascia and tendons at shoulder and upper arm level, unspecified arm, initial encounter: Secondary | ICD-10-CM | POA: Diagnosis not present

## 2014-02-13 DIAGNOSIS — M62838 Other muscle spasm: Secondary | ICD-10-CM | POA: Diagnosis not present

## 2014-02-13 DIAGNOSIS — S43499A Other sprain of unspecified shoulder joint, initial encounter: Secondary | ICD-10-CM | POA: Diagnosis not present

## 2014-02-17 ENCOUNTER — Encounter: Payer: Self-pay | Admitting: *Deleted

## 2014-02-17 ENCOUNTER — Encounter: Payer: Medicare Other | Attending: Internal Medicine | Admitting: *Deleted

## 2014-02-17 DIAGNOSIS — E119 Type 2 diabetes mellitus without complications: Secondary | ICD-10-CM

## 2014-02-17 DIAGNOSIS — Z713 Dietary counseling and surveillance: Secondary | ICD-10-CM | POA: Insufficient documentation

## 2014-02-17 DIAGNOSIS — S46819A Strain of other muscles, fascia and tendons at shoulder and upper arm level, unspecified arm, initial encounter: Secondary | ICD-10-CM | POA: Diagnosis not present

## 2014-02-17 DIAGNOSIS — S43499A Other sprain of unspecified shoulder joint, initial encounter: Secondary | ICD-10-CM | POA: Diagnosis not present

## 2014-02-17 DIAGNOSIS — M62838 Other muscle spasm: Secondary | ICD-10-CM | POA: Diagnosis not present

## 2014-02-17 NOTE — Progress Notes (Signed)
  Medical Nutrition Therapy:  Appt start time: 1100 end time:  1200.  Assessment:  Primary concerns today: nutrition education for new onset of diabetes and for obesity. Patient states she goes by [employer]". She declined being weighed today, states weight loss of 7 pounds in past 2 months. No meter yet. 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 She states she has gained about 40 pounds since she started this job, partially due to extensive travel, eating out and less time to exercise. When she is home she enjoys doing water walking regularly. She also states she has knee problems so it is difficult to walk except in the water. She has started taking the Metformin, does not complain of any GI upset from it at this time.  Preferred Learning Style: all of the following  Auditory  Visual  Hands on  Learning Readiness:   Ready  Change in progress  MEDICATIONS: see list, metformin is diabetes medication   DIETARY INTAKE:  24-hr recall:  B ( AM): oatmeal blueberries, 2 bacon, OR bacon and egg English muffin with coffee  Snk ( AM): occasionally raw vegetables OR popcorn  L ( PM): left overs OR homemade soup with 1/2 sandwich, water or hot tea or coffee Snk ( PM): same as AM, or crackers D ( PM): meat, starch, vegetables, water or hot tea or coffee  Snk ( PM): occasionally ice cream light Dove bar Beverages: water or hot tea or coffee   Usual physical activity: water aerobics  Estimated energy needs: 1400 calories 158 g carbohydrates 105 g protein 39 g fat    Intervention:  Nutrition counseling and diabetes education initiated. Discussed Carb Counting as method of portion control, reading food labels, and benefits of increased activity. Offered suggestions of nutritious beverages she could use as occasional meal replacement when traveling such as [employer], Glucerna or Boost. Provided sample of Boost with Lot # [id number], Exp Date:  03/2014.  Plan:  Aim for 3 Carb Choices per meal (45 grams) +/- 1 either way  Aim for 0-1 Carbs per snack if hungry  Continue reading food labels for Total Carbohydrate and Fat Grams of foods Continue with your activity level by water walking as tolerated  Teaching Method Utilized: Visual, Auditory and Hands on  Handouts given during visit include: Carb Counting and Food Label handouts Meal Plan Card Menu Planner sheet  Barriers to learning/adherence to lifestyle change: extensive travel with work  Demonstrated degree of understanding via:  Teach Back   Monitoring/Evaluation:  Dietary intake, exercise, reading food labels, and body weight prn.

## 2014-02-17 NOTE — Patient Instructions (Signed)
Plan:  Aim for 3 Carb Choices per meal (45 grams) +/- 1 either way  Aim for 0-1 Carbs per snack if hungry  Continue reading food labels for Total Carbohydrate and Fat Grams of foods Continue with your activity level by water walking as tolerated

## 2014-02-23 DIAGNOSIS — S46819A Strain of other muscles, fascia and tendons at shoulder and upper arm level, unspecified arm, initial encounter: Secondary | ICD-10-CM | POA: Diagnosis not present

## 2014-02-23 DIAGNOSIS — M62838 Other muscle spasm: Secondary | ICD-10-CM | POA: Diagnosis not present

## 2014-02-23 DIAGNOSIS — S43499A Other sprain of unspecified shoulder joint, initial encounter: Secondary | ICD-10-CM | POA: Diagnosis not present

## 2014-02-24 DIAGNOSIS — H269 Unspecified cataract: Secondary | ICD-10-CM | POA: Diagnosis not present

## 2014-02-24 DIAGNOSIS — H2510 Age-related nuclear cataract, unspecified eye: Secondary | ICD-10-CM | POA: Diagnosis not present

## 2014-02-24 DIAGNOSIS — H4011X0 Primary open-angle glaucoma, stage unspecified: Secondary | ICD-10-CM | POA: Diagnosis not present

## 2014-02-24 DIAGNOSIS — H409 Unspecified glaucoma: Secondary | ICD-10-CM | POA: Diagnosis not present

## 2014-02-24 DIAGNOSIS — H25019 Cortical age-related cataract, unspecified eye: Secondary | ICD-10-CM | POA: Diagnosis not present

## 2014-03-02 DIAGNOSIS — S43499A Other sprain of unspecified shoulder joint, initial encounter: Secondary | ICD-10-CM | POA: Diagnosis not present

## 2014-03-02 DIAGNOSIS — M62838 Other muscle spasm: Secondary | ICD-10-CM | POA: Diagnosis not present

## 2014-03-09 DIAGNOSIS — M62838 Other muscle spasm: Secondary | ICD-10-CM | POA: Diagnosis not present

## 2014-03-09 DIAGNOSIS — S46819A Strain of other muscles, fascia and tendons at shoulder and upper arm level, unspecified arm, initial encounter: Secondary | ICD-10-CM | POA: Diagnosis not present

## 2014-03-09 DIAGNOSIS — S43499A Other sprain of unspecified shoulder joint, initial encounter: Secondary | ICD-10-CM | POA: Diagnosis not present

## 2014-03-16 DIAGNOSIS — S43499A Other sprain of unspecified shoulder joint, initial encounter: Secondary | ICD-10-CM | POA: Diagnosis not present

## 2014-03-16 DIAGNOSIS — S46819A Strain of other muscles, fascia and tendons at shoulder and upper arm level, unspecified arm, initial encounter: Secondary | ICD-10-CM | POA: Diagnosis not present

## 2014-03-16 DIAGNOSIS — M62838 Other muscle spasm: Secondary | ICD-10-CM | POA: Diagnosis not present

## 2014-03-23 DIAGNOSIS — M62838 Other muscle spasm: Secondary | ICD-10-CM | POA: Diagnosis not present

## 2014-03-23 DIAGNOSIS — S43499A Other sprain of unspecified shoulder joint, initial encounter: Secondary | ICD-10-CM | POA: Diagnosis not present

## 2014-03-23 DIAGNOSIS — S46819A Strain of other muscles, fascia and tendons at shoulder and upper arm level, unspecified arm, initial encounter: Secondary | ICD-10-CM | POA: Diagnosis not present

## 2014-03-23 DIAGNOSIS — H3530 Unspecified macular degeneration: Secondary | ICD-10-CM | POA: Diagnosis not present

## 2014-03-30 DIAGNOSIS — S43499A Other sprain of unspecified shoulder joint, initial encounter: Secondary | ICD-10-CM | POA: Diagnosis not present

## 2014-03-30 DIAGNOSIS — Z79899 Other long term (current) drug therapy: Secondary | ICD-10-CM | POA: Diagnosis not present

## 2014-03-30 DIAGNOSIS — E119 Type 2 diabetes mellitus without complications: Secondary | ICD-10-CM | POA: Diagnosis not present

## 2014-03-30 DIAGNOSIS — I10 Essential (primary) hypertension: Secondary | ICD-10-CM | POA: Diagnosis not present

## 2014-03-30 DIAGNOSIS — M62838 Other muscle spasm: Secondary | ICD-10-CM | POA: Diagnosis not present

## 2014-03-30 DIAGNOSIS — S46819A Strain of other muscles, fascia and tendons at shoulder and upper arm level, unspecified arm, initial encounter: Secondary | ICD-10-CM | POA: Diagnosis not present

## 2014-04-10 DIAGNOSIS — M62838 Other muscle spasm: Secondary | ICD-10-CM | POA: Diagnosis not present

## 2014-04-10 DIAGNOSIS — S43499A Other sprain of unspecified shoulder joint, initial encounter: Secondary | ICD-10-CM | POA: Diagnosis not present

## 2014-04-20 DIAGNOSIS — S46819A Strain of other muscles, fascia and tendons at shoulder and upper arm level, unspecified arm, initial encounter: Secondary | ICD-10-CM | POA: Diagnosis not present

## 2014-04-20 DIAGNOSIS — M62838 Other muscle spasm: Secondary | ICD-10-CM | POA: Diagnosis not present

## 2014-04-20 DIAGNOSIS — S43499A Other sprain of unspecified shoulder joint, initial encounter: Secondary | ICD-10-CM | POA: Diagnosis not present

## 2014-05-13 DIAGNOSIS — S43499A Other sprain of unspecified shoulder joint, initial encounter: Secondary | ICD-10-CM | POA: Diagnosis not present

## 2014-05-13 DIAGNOSIS — M62838 Other muscle spasm: Secondary | ICD-10-CM | POA: Diagnosis not present

## 2014-05-25 DIAGNOSIS — S43499A Other sprain of unspecified shoulder joint, initial encounter: Secondary | ICD-10-CM | POA: Diagnosis not present

## 2014-05-25 DIAGNOSIS — M62838 Other muscle spasm: Secondary | ICD-10-CM | POA: Diagnosis not present

## 2014-06-05 DIAGNOSIS — L821 Other seborrheic keratosis: Secondary | ICD-10-CM | POA: Diagnosis not present

## 2014-06-05 DIAGNOSIS — L570 Actinic keratosis: Secondary | ICD-10-CM | POA: Diagnosis not present

## 2014-06-05 DIAGNOSIS — D1801 Hemangioma of skin and subcutaneous tissue: Secondary | ICD-10-CM | POA: Diagnosis not present

## 2014-06-22 DIAGNOSIS — E119 Type 2 diabetes mellitus without complications: Secondary | ICD-10-CM | POA: Diagnosis not present

## 2014-08-10 DIAGNOSIS — H4011X0 Primary open-angle glaucoma, stage unspecified: Secondary | ICD-10-CM | POA: Diagnosis not present

## 2014-08-10 DIAGNOSIS — H16109 Unspecified superficial keratitis, unspecified eye: Secondary | ICD-10-CM | POA: Diagnosis not present

## 2014-08-10 DIAGNOSIS — H409 Unspecified glaucoma: Secondary | ICD-10-CM | POA: Diagnosis not present

## 2014-08-10 DIAGNOSIS — H18599 Other hereditary corneal dystrophies, unspecified eye: Secondary | ICD-10-CM | POA: Diagnosis not present

## 2014-08-24 DIAGNOSIS — Z0000 Encounter for general adult medical examination without abnormal findings: Secondary | ICD-10-CM | POA: Diagnosis not present

## 2014-08-24 DIAGNOSIS — Z1331 Encounter for screening for depression: Secondary | ICD-10-CM | POA: Diagnosis not present

## 2014-08-24 DIAGNOSIS — I10 Essential (primary) hypertension: Secondary | ICD-10-CM | POA: Diagnosis not present

## 2014-08-24 DIAGNOSIS — E785 Hyperlipidemia, unspecified: Secondary | ICD-10-CM | POA: Diagnosis not present

## 2014-08-24 DIAGNOSIS — Z23 Encounter for immunization: Secondary | ICD-10-CM | POA: Diagnosis not present

## 2014-08-24 DIAGNOSIS — G4733 Obstructive sleep apnea (adult) (pediatric): Secondary | ICD-10-CM | POA: Diagnosis not present

## 2014-08-24 DIAGNOSIS — E119 Type 2 diabetes mellitus without complications: Secondary | ICD-10-CM | POA: Diagnosis not present

## 2014-09-23 DIAGNOSIS — Z111 Encounter for screening for respiratory tuberculosis: Secondary | ICD-10-CM | POA: Diagnosis not present

## 2014-09-28 DIAGNOSIS — E119 Type 2 diabetes mellitus without complications: Secondary | ICD-10-CM | POA: Diagnosis not present

## 2014-09-28 DIAGNOSIS — E785 Hyperlipidemia, unspecified: Secondary | ICD-10-CM | POA: Diagnosis not present

## 2014-11-16 DIAGNOSIS — H16103 Unspecified superficial keratitis, bilateral: Secondary | ICD-10-CM | POA: Diagnosis not present

## 2014-11-16 DIAGNOSIS — H4011X2 Primary open-angle glaucoma, moderate stage: Secondary | ICD-10-CM | POA: Diagnosis not present

## 2014-11-16 DIAGNOSIS — H1859 Other hereditary corneal dystrophies: Secondary | ICD-10-CM | POA: Diagnosis not present

## 2014-11-16 DIAGNOSIS — H04123 Dry eye syndrome of bilateral lacrimal glands: Secondary | ICD-10-CM | POA: Diagnosis not present

## 2014-12-04 DIAGNOSIS — I10 Essential (primary) hypertension: Secondary | ICD-10-CM | POA: Diagnosis not present

## 2014-12-04 DIAGNOSIS — E119 Type 2 diabetes mellitus without complications: Secondary | ICD-10-CM | POA: Diagnosis not present

## 2014-12-04 DIAGNOSIS — G4733 Obstructive sleep apnea (adult) (pediatric): Secondary | ICD-10-CM | POA: Diagnosis not present

## 2014-12-23 DIAGNOSIS — H04123 Dry eye syndrome of bilateral lacrimal glands: Secondary | ICD-10-CM | POA: Diagnosis not present

## 2014-12-23 DIAGNOSIS — H1859 Other hereditary corneal dystrophies: Secondary | ICD-10-CM | POA: Diagnosis not present

## 2014-12-23 DIAGNOSIS — H4011X2 Primary open-angle glaucoma, moderate stage: Secondary | ICD-10-CM | POA: Diagnosis not present

## 2015-02-22 DIAGNOSIS — H04123 Dry eye syndrome of bilateral lacrimal glands: Secondary | ICD-10-CM | POA: Diagnosis not present

## 2015-02-22 DIAGNOSIS — H2513 Age-related nuclear cataract, bilateral: Secondary | ICD-10-CM | POA: Diagnosis not present

## 2015-02-22 DIAGNOSIS — H4011X4 Primary open-angle glaucoma, indeterminate stage: Secondary | ICD-10-CM | POA: Diagnosis not present

## 2015-02-22 DIAGNOSIS — H3531 Nonexudative age-related macular degeneration: Secondary | ICD-10-CM | POA: Diagnosis not present

## 2015-03-29 DIAGNOSIS — H3531 Nonexudative age-related macular degeneration: Secondary | ICD-10-CM | POA: Diagnosis not present

## 2015-03-29 DIAGNOSIS — H2513 Age-related nuclear cataract, bilateral: Secondary | ICD-10-CM | POA: Diagnosis not present

## 2015-03-29 DIAGNOSIS — H04123 Dry eye syndrome of bilateral lacrimal glands: Secondary | ICD-10-CM | POA: Diagnosis not present

## 2015-03-29 DIAGNOSIS — H4011X4 Primary open-angle glaucoma, indeterminate stage: Secondary | ICD-10-CM | POA: Diagnosis not present

## 2015-04-21 DIAGNOSIS — M25561 Pain in right knee: Secondary | ICD-10-CM | POA: Diagnosis not present

## 2015-04-23 DIAGNOSIS — H4011X1 Primary open-angle glaucoma, mild stage: Secondary | ICD-10-CM | POA: Diagnosis not present

## 2015-05-03 DIAGNOSIS — H4011X2 Primary open-angle glaucoma, moderate stage: Secondary | ICD-10-CM | POA: Diagnosis not present

## 2015-05-03 DIAGNOSIS — H16201 Unspecified keratoconjunctivitis, right eye: Secondary | ICD-10-CM | POA: Diagnosis not present

## 2015-05-03 DIAGNOSIS — H04123 Dry eye syndrome of bilateral lacrimal glands: Secondary | ICD-10-CM | POA: Diagnosis not present

## 2015-05-03 DIAGNOSIS — H2000 Unspecified acute and subacute iridocyclitis: Secondary | ICD-10-CM | POA: Diagnosis not present

## 2015-05-21 DIAGNOSIS — Z780 Asymptomatic menopausal state: Secondary | ICD-10-CM | POA: Diagnosis not present

## 2015-05-21 DIAGNOSIS — G4733 Obstructive sleep apnea (adult) (pediatric): Secondary | ICD-10-CM | POA: Diagnosis not present

## 2015-05-21 DIAGNOSIS — I10 Essential (primary) hypertension: Secondary | ICD-10-CM | POA: Diagnosis not present

## 2015-05-21 DIAGNOSIS — E1165 Type 2 diabetes mellitus with hyperglycemia: Secondary | ICD-10-CM | POA: Diagnosis not present

## 2015-06-14 DIAGNOSIS — L570 Actinic keratosis: Secondary | ICD-10-CM | POA: Diagnosis not present

## 2015-06-14 DIAGNOSIS — D225 Melanocytic nevi of trunk: Secondary | ICD-10-CM | POA: Diagnosis not present

## 2015-06-14 DIAGNOSIS — L821 Other seborrheic keratosis: Secondary | ICD-10-CM | POA: Diagnosis not present

## 2015-06-14 DIAGNOSIS — L814 Other melanin hyperpigmentation: Secondary | ICD-10-CM | POA: Diagnosis not present

## 2015-06-15 DIAGNOSIS — M25561 Pain in right knee: Secondary | ICD-10-CM | POA: Diagnosis not present

## 2015-06-21 DIAGNOSIS — R531 Weakness: Secondary | ICD-10-CM | POA: Diagnosis not present

## 2015-06-21 DIAGNOSIS — M25361 Other instability, right knee: Secondary | ICD-10-CM | POA: Diagnosis not present

## 2015-06-21 DIAGNOSIS — M25561 Pain in right knee: Secondary | ICD-10-CM | POA: Diagnosis not present

## 2015-06-23 DIAGNOSIS — M25561 Pain in right knee: Secondary | ICD-10-CM | POA: Diagnosis not present

## 2015-06-23 DIAGNOSIS — M25361 Other instability, right knee: Secondary | ICD-10-CM | POA: Diagnosis not present

## 2015-06-23 DIAGNOSIS — R531 Weakness: Secondary | ICD-10-CM | POA: Diagnosis not present

## 2015-07-01 DIAGNOSIS — M25361 Other instability, right knee: Secondary | ICD-10-CM | POA: Diagnosis not present

## 2015-07-01 DIAGNOSIS — M25561 Pain in right knee: Secondary | ICD-10-CM | POA: Diagnosis not present

## 2015-07-01 DIAGNOSIS — R531 Weakness: Secondary | ICD-10-CM | POA: Diagnosis not present

## 2015-07-07 DIAGNOSIS — M25361 Other instability, right knee: Secondary | ICD-10-CM | POA: Diagnosis not present

## 2015-07-07 DIAGNOSIS — R531 Weakness: Secondary | ICD-10-CM | POA: Diagnosis not present

## 2015-07-07 DIAGNOSIS — M25561 Pain in right knee: Secondary | ICD-10-CM | POA: Diagnosis not present

## 2015-07-13 DIAGNOSIS — M25361 Other instability, right knee: Secondary | ICD-10-CM | POA: Diagnosis not present

## 2015-07-13 DIAGNOSIS — R531 Weakness: Secondary | ICD-10-CM | POA: Diagnosis not present

## 2015-07-13 DIAGNOSIS — M25561 Pain in right knee: Secondary | ICD-10-CM | POA: Diagnosis not present

## 2015-07-21 ENCOUNTER — Encounter (HOSPITAL_COMMUNITY): Payer: Self-pay | Admitting: Physician Assistant

## 2015-07-21 DIAGNOSIS — E119 Type 2 diabetes mellitus without complications: Secondary | ICD-10-CM

## 2015-07-21 DIAGNOSIS — G4730 Sleep apnea, unspecified: Secondary | ICD-10-CM | POA: Diagnosis present

## 2015-07-21 DIAGNOSIS — M1711 Unilateral primary osteoarthritis, right knee: Secondary | ICD-10-CM | POA: Diagnosis present

## 2015-07-21 DIAGNOSIS — M25561 Pain in right knee: Secondary | ICD-10-CM | POA: Diagnosis not present

## 2015-07-21 DIAGNOSIS — H04123 Dry eye syndrome of bilateral lacrimal glands: Secondary | ICD-10-CM | POA: Diagnosis not present

## 2015-07-21 DIAGNOSIS — I10 Essential (primary) hypertension: Secondary | ICD-10-CM | POA: Diagnosis present

## 2015-07-21 DIAGNOSIS — E669 Obesity, unspecified: Secondary | ICD-10-CM | POA: Diagnosis present

## 2015-07-21 DIAGNOSIS — E785 Hyperlipidemia, unspecified: Secondary | ICD-10-CM | POA: Diagnosis present

## 2015-07-21 DIAGNOSIS — H3531 Nonexudative age-related macular degeneration: Secondary | ICD-10-CM | POA: Diagnosis not present

## 2015-07-21 DIAGNOSIS — H4011X1 Primary open-angle glaucoma, mild stage: Secondary | ICD-10-CM | POA: Diagnosis not present

## 2015-07-21 NOTE — H&P (Addendum)
TOTAL KNEE ADMISSION H&P  Patient is being admitted for right total knee arthroplasty.  Subjective:  Chief Complaint:right knee pain.  HPI: Natalie Riddle, 69 y.o. female, has a history of pain and functional disability in the right knee due to arthritis and has failed non-surgical conservative treatments for greater than 12 weeks to includeNSAID's and/or analgesics, corticosteriod injections, viscosupplementation injections, flexibility and strengthening excercises, supervised PT with diminished ADL's post treatment, use of assistive devices, weight reduction as appropriate and activity modification.  Onset of symptoms was gradual, starting 10 years ago with gradually worsening course since that time. The patient noted prior procedures on the knee to include  arthroscopy and menisectomy on the right knee(s).  Patient currently rates pain in the right knee(s) at 10 out of 10 with activity. Patient has night pain, worsening of pain with activity and weight bearing, pain that interferes with activities of daily living, crepitus and joint swelling.  Patient has evidence of subchondral sclerosis, periarticular osteophytes and joint space narrowing by imaging studies.  There is no active infection.  Patient Active Problem List   Diagnosis Date Noted  . Diabetes mellitus without complication   . Hypertension   . Hyperlipidemia   . Obesity   . Sleep apnea   . Primary localized osteoarthritis of right knee    Past Medical History  Diagnosis Date  . Hyperlipidemia     takes Atorvastatin daily  . Cancer   . Primary localized osteoarthritis of right knee   . Diabetes mellitus without complication     takes Metformin daily  . Hypertension     takes Benicar daily  . Glaucoma     uses eye drops daily and takes Diamox daily  . Joint pain   . Joint swelling   . History of gastric ulcer   . History of colon polyps     benign  . Diverticulosis   . Urinary urgency   . Sleep apnea     uses  CPAP  . Macular degeneration     dry    Past Surgical History  Procedure Laterality Date  . Cesarean section  41 yrs ago  . Tubal ligation  3yrs ago  . Laup    . Knee arthroscopy Right   . Colonoscopy    . Esophagogastroduodenoscopy    . Cartaract surgery Left     No current facility-administered medications for this encounter.  Current outpatient prescriptions:  .  acetaZOLAMIDE (DIAMOX) 500 MG capsule, Take 500 mg by mouth daily., Disp: , Rfl:  .  aspirin 81 MG tablet, Take 81 mg by mouth daily., Disp: , Rfl:  .  atorvastatin (LIPITOR) 20 MG tablet, Take 20 mg by mouth daily., Disp: , Rfl:  .  celecoxib (CELEBREX) 200 MG capsule, Take 200 mg by mouth daily., Disp: , Rfl:  .  Cholecalciferol (VITAMIN D) 2000 UNITS tablet, Take 2,000 Units by mouth daily., Disp: , Rfl:  .  Coenzyme Q10 (CO Q 10) 100 MG CAPS, Take 1 capsule by mouth daily., Disp: , Rfl:  .  cycloSPORINE (RESTASIS) 0.05 % ophthalmic emulsion, Place 1 drop into both eyes 2 (two) times daily., Disp: , Rfl:  .  Estradiol-Norethindrone Acet (ACTIVELLA) 0.5-0.1 MG per tablet, Take 1 tablet by mouth every other day., Disp: , Rfl:  .  Hyaluronic Acid-Vitamin C (HYALURONIC ACID PO), Take 300 mg by mouth daily., Disp: , Rfl:  .  LUTEIN PO, Take 1 capsule by mouth daily., Disp: , Rfl:  .  metFORMIN (GLUCOPHAGE) 500 MG tablet, Take 500 mg by mouth 2 (two) times daily with a meal., Disp: , Rfl:  .  Misc Natural Products (OSTEO BI-FLEX JOINT SHIELD PO), Take 2 tablets by mouth daily. With MSM, Disp: , Rfl:  .  Multiple Vitamins-Minerals (MULTIVITAMIN WITH MINERALS) tablet, Take 1 tablet by mouth daily., Disp: , Rfl:  .  Multiple Vitamins-Minerals (PRESERVISION AREDS PO), Take 1 capsule by mouth daily., Disp: , Rfl:  .  olmesartan-hydrochlorothiazide (BENICAR HCT) 40-25 MG per tablet, Take 1 tablet by mouth daily., Disp: , Rfl:  .  OVER THE COUNTER MEDICATION, Take 2 capsules by mouth daily. Natural Joint, Disp: , Rfl:  .   Polyvinyl Alcohol (LIQUID TEARS OP), Apply 1 drop to eye daily as needed., Disp: , Rfl:  .  TURMERIC PO, Take 2,000 mg by mouth daily., Disp: , Rfl:  .  White Petrolatum-Mineral Oil (GENTEAL PM OP), Apply 1 drop to eye at bedtime. Preservative Free, Disp: , Rfl:     No Known Allergies   History  Substance Use Topics  . Smoking status: Former Research scientist (life sciences)  . Smokeless tobacco: Not on file     Comment: quit smoking in 1988  . Alcohol Use: 0.0 oz/week    0 Standard drinks or equivalent per week     Comment: socially    Family History  Problem Relation Age of Onset  . Hypertension    . Heart disease       Review of Systems  Constitutional: Negative.   HENT: Negative.   Eyes: Negative.   Respiratory: Negative.   Cardiovascular: Negative.   Gastrointestinal: Negative.   Genitourinary: Negative.   Musculoskeletal: Positive for back pain and joint pain.       Right knee  Skin: Negative.   Neurological: Negative.   Endo/Heme/Allergies: Negative.     Objective:  Physical Exam  Constitutional: She is oriented to person, place, and time. She appears well-developed and well-nourished.  HENT:  Head: Normocephalic and atraumatic.  Eyes: Conjunctivae are normal. Pupils are equal, round, and reactive to light.  Neck: Neck supple.  Cardiovascular: Normal rate, regular rhythm and normal heart sounds.   Respiratory: Effort normal.  GI: Soft.  Genitourinary:  Not pertinent to current symptomatology therefore not examined.  Musculoskeletal:  Examination of her right knee reveals pain medial and laterally. There is 1+ crepitation and 1+ synovitis. Diffuse pain. Range of motion is 0-120 degrees. The knee is stable with normal patellar tracking.  Examination of her left knee reveals mild pain and minimal swelling. Full range of motion. The knee is stable with normal patellar tracking. Vascular exam: pulses 2+ and symmetric.   Neurological: She is alert and oriented to person, place, and time.   Skin: Skin is warm and dry.  Psychiatric: She has a normal mood and affect. Her behavior is normal.    Vital signs in last 24 hours: Temp:  [98.2 F (36.8 C)] 98.2 F (36.8 C) (07/28 0917) Pulse Rate:  [82] 82 (07/28 0917) Resp:  [20] 20 (07/28 0917) BP: (134)/(68) 134/68 mmHg (07/28 0917) SpO2:  [98 %] 98 % (07/28 0917) Weight:  [110.723 kg (244 lb 1.6 oz)] 110.723 kg (244 lb 1.6 oz) (07/28 0917)  Labs:   Estimated body mass index is 39.24 kg/(m^2) as calculated from the following:   Height as of this encounter: 5\' 6"  (1.676 m).   Weight as of this encounter: 110.224 kg (243 lb).   Imaging Review Plain radiographs demonstrate severe degenerative joint  disease of the right knee(s). The overall alignment ismild varus. The bone quality appears to be good for age and reported activity level.  Assessment/Plan:  End stage arthritis, right knee  Principal Problem:   Primary localized osteoarthritis of right knee Active Problems:   Diabetes mellitus without complication   Hypertension   Hyperlipidemia   Obesity   Sleep apnea   The patient history, physical examination, clinical judgment of the provider and imaging studies are consistent with end stage degenerative joint disease of the right knee(s) and total knee arthroplasty is deemed medically necessary. The treatment options including medical management, injection therapy arthroscopy and arthroplasty were discussed at length. The risks and benefits of total knee arthroplasty were presented and reviewed. The risks due to aseptic loosening, infection, stiffness, patella tracking problems, thromboembolic complications and other imponderables were discussed. The patient acknowledged the explanation, agreed to proceed with the plan and consent was signed. Patient is being admitted for inpatient treatment for surgery, pain control, PT, OT, prophylactic antibiotics, VTE prophylaxis, progressive ambulation and ADL's and discharge  planning. The patient is planning to be discharged home with home health services  Pre op EKG showed infarct age undetermined.  With no cardiac history, we have referred this patient to [facility] for Cardiac Evaluation prior to surgery.  Her appt is August 2.  Decie Verne A. Kaleen Mask Physician Assistant [facility] [phone]  07/23/2015, 7:50 AM

## 2015-07-22 ENCOUNTER — Encounter (HOSPITAL_COMMUNITY): Payer: Self-pay

## 2015-07-22 ENCOUNTER — Other Ambulatory Visit: Payer: Self-pay

## 2015-07-22 ENCOUNTER — Encounter (HOSPITAL_COMMUNITY)
Admission: RE | Admit: 2015-07-22 | Discharge: 2015-07-22 | Disposition: A | Discharge status: Home / Self Care | Payer: Medicare Other | Source: Ambulatory Visit | Attending: Orthopedic Surgery | Admitting: Orthopedic Surgery

## 2015-07-22 DIAGNOSIS — Z79899 Other long term (current) drug therapy: Secondary | ICD-10-CM | POA: Insufficient documentation

## 2015-07-22 DIAGNOSIS — Z7982 Long term (current) use of aspirin: Secondary | ICD-10-CM | POA: Insufficient documentation

## 2015-07-22 DIAGNOSIS — I10 Essential (primary) hypertension: Secondary | ICD-10-CM | POA: Diagnosis not present

## 2015-07-22 DIAGNOSIS — Z01812 Encounter for preprocedural laboratory examination: Secondary | ICD-10-CM | POA: Diagnosis not present

## 2015-07-22 DIAGNOSIS — Z87891 Personal history of nicotine dependence: Secondary | ICD-10-CM | POA: Insufficient documentation

## 2015-07-22 DIAGNOSIS — E119 Type 2 diabetes mellitus without complications: Secondary | ICD-10-CM | POA: Diagnosis not present

## 2015-07-22 DIAGNOSIS — H3530 Unspecified macular degeneration: Secondary | ICD-10-CM | POA: Insufficient documentation

## 2015-07-22 DIAGNOSIS — G4733 Obstructive sleep apnea (adult) (pediatric): Secondary | ICD-10-CM | POA: Diagnosis not present

## 2015-07-22 DIAGNOSIS — Z01818 Encounter for other preprocedural examination: Secondary | ICD-10-CM | POA: Diagnosis not present

## 2015-07-22 DIAGNOSIS — M179 Osteoarthritis of knee, unspecified: Secondary | ICD-10-CM | POA: Diagnosis not present

## 2015-07-22 DIAGNOSIS — Z0183 Encounter for blood typing: Secondary | ICD-10-CM | POA: Insufficient documentation

## 2015-07-22 HISTORY — DX: Diverticulosis of intestine, part unspecified, without perforation or abscess without bleeding: K57.90

## 2015-07-22 HISTORY — DX: Pain in unspecified joint: M25.50

## 2015-07-22 HISTORY — DX: Unspecified macular degeneration: H35.30

## 2015-07-22 HISTORY — DX: Unspecified glaucoma: H40.9

## 2015-07-22 HISTORY — DX: Personal history of peptic ulcer disease: Z87.11

## 2015-07-22 HISTORY — DX: Urgency of urination: R39.15

## 2015-07-22 HISTORY — DX: Personal history of colonic polyps: Z86.010

## 2015-07-22 HISTORY — DX: Personal history of other diseases of the digestive system: Z87.19

## 2015-07-22 HISTORY — DX: Effusion, unspecified joint: M25.40

## 2015-07-22 LAB — CBC WITH DIFFERENTIAL/PLATELET
Basophils Absolute: 0 10*3/uL (ref 0.0–0.1)
Basophils Relative: 1 % (ref 0–1)
EOS PCT: 6 % — AB (ref 0–5)
Eosinophils Absolute: 0.5 10*3/uL (ref 0.0–0.7)
HCT: 38.2 % (ref 36.0–46.0)
Hemoglobin: 12.7 g/dL (ref 12.0–15.0)
Lymphocytes Relative: 30 % (ref 12–46)
Lymphs Abs: 2.3 10*3/uL (ref 0.7–4.0)
MCH: 31.2 pg (ref 26.0–34.0)
MCHC: 33.2 g/dL (ref 30.0–36.0)
MCV: 93.9 fL (ref 78.0–100.0)
Monocytes Absolute: 0.5 10*3/uL (ref 0.1–1.0)
Monocytes Relative: 6 % (ref 3–12)
Neutro Abs: 4.5 10*3/uL (ref 1.7–7.7)
Neutrophils Relative %: 57 % (ref 43–77)
PLATELETS: 181 10*3/uL (ref 150–400)
RBC: 4.07 MIL/uL (ref 3.87–5.11)
RDW: 13.5 % (ref 11.5–15.5)
WBC: 7.9 10*3/uL (ref 4.0–10.5)

## 2015-07-22 LAB — COMPREHENSIVE METABOLIC PANEL
ALK PHOS: 55 U/L (ref 38–126)
ALT: 27 U/L (ref 14–54)
ANION GAP: 9 (ref 5–15)
AST: 23 U/L (ref 15–41)
Albumin: 3.8 g/dL (ref 3.5–5.0)
BILIRUBIN TOTAL: 0.5 mg/dL (ref 0.3–1.2)
BUN: 21 mg/dL — AB (ref 6–20)
CO2: 21 mmol/L — ABNORMAL LOW (ref 22–32)
CREATININE: 0.83 mg/dL (ref 0.44–1.00)
Calcium: 9.5 mg/dL (ref 8.9–10.3)
Chloride: 109 mmol/L (ref 101–111)
GFR calc Af Amer: >60 mL/min (ref >60)
Glucose, Bld: 132 mg/dL — ABNORMAL HIGH (ref 65–99)
POTASSIUM: 3.7 mmol/L (ref 3.5–5.1)
Sodium: 139 mmol/L (ref 135–145)
TOTAL PROTEIN: 6.9 g/dL (ref 6.5–8.1)

## 2015-07-22 LAB — TYPE AND SCREEN
ABO/RH(D): A POS
Antibody Screen: NEGATIVE

## 2015-07-22 LAB — ABO/RH: ABO/RH(D): A POS

## 2015-07-22 LAB — SURGICAL PCR SCREEN
MRSA, PCR: NEGATIVE
STAPHYLOCOCCUS AUREUS: NEGATIVE

## 2015-07-22 LAB — GLUCOSE, CAPILLARY: Glucose-Capillary: 142 mg/dL — ABNORMAL HIGH (ref 65–99)

## 2015-07-22 LAB — PROTIME-INR
INR: 1.1 (ref 0.00–1.49)
PROTHROMBIN TIME: 14.4 s (ref 11.6–15.2)

## 2015-07-22 LAB — APTT: aPTT: 25 seconds (ref 24–37)

## 2015-07-22 MED ORDER — CHLORHEXIDINE GLUCONATE 4 % EX LIQD: 60.0000 mL | Freq: Once | CUTANEOUS | Status: DC

## 2015-07-22 MED ORDER — POVIDONE-IODINE 7.5 % EX SOLN: Freq: Once | CUTANEOUS | Status: DC

## 2015-07-22 NOTE — Progress Notes (Addendum)
Cardiologist denies having one  Medical Md is Dr.John Laurann [place]   Echo denies ever having one  Stress test denies ever having one  Heart cath denies ever having one  EKG denies having one in past yr  CXR denies having one in past yr

## 2015-07-22 NOTE — Progress Notes (Addendum)
Anesthesia Chart Review: Patient is a 69 year old female scheduled for right TKR on 08/02/15 by Dr. Noemi Chapel.  History includes former smoker, HLD, DM2, HTN, glaucoma, OSA uses CPAP, macular degeneration. PCP is Dr. Lavone Orn. She is scheduled for cardiac clearance with Dr. Skeet Latch on 07/27/15.   Meds include Diamox, ASA 81mg , Lipitor, Celebrex, metformin, Benicar HCT.  07/22/15 EKG: NSR, low voltage QRS, cannot rule out anterior infarct (age undetermined). Overall, I think her EKG is stable when compared to 11/06/05 tracing in [place].  Preoperative labs noted. Urine culture and A1C are pending.  Follow-up once she has had her cardiology evaluation next week.  [facility] Short Stay Center/Anesthesiology Phone [phone] 07/22/2015 4:15 PM  Addendum: Urine culture showed multiple species, recollect as indicated. A1C 7.1.   Patient was seen by Dr. Oval Linsey today for pre-operative cardiac clearance. Her note states, "The patient does not have any unstable cardiac conditions. Upon evaluation today, she can achieve 4 METs or greater without anginal symptoms. According to [facility] and AHA guidelines, she requires no further cardiac workup prior to her noncardiac surgery and should be at acceptable risk. Our service is available as necessary in the perioperative period." PRN follow-up recommended.  If no acute changes then I would anticipate that she can proceed as planned.  George Hugh [facility] Short Stay Center/Anesthesiology Phone [phone] 07/27/2015 5:41 PM

## 2015-07-22 NOTE — Pre-Procedure Instructions (Signed)
Natalie Riddle  07/22/2015      [facility] - [place], [address] [place] [address] Phone: [phone] Fax: [phone]    Your procedure is scheduled on Mon, Aug 8 @ 7:15 AM  Report to [facility] Admitting at 5:30 AM  Call this number if you have problems the morning of surgery:  [phone]   Remember:  Do not eat food or drink liquids after midnight.  Take these medicines the morning of surgery with A SIP OF WATER Eye Drops             Stop taking your Aspirin,CO Q10,Vitamins,and any Herbal Medications. No Goody's,BC's,Aleve,or Ibuprofen.   Do not wear jewelry, make-up or nail polish.  Do not wear lotions, powders, or perfumes.  You may wear deodorant.  Do not shave 48 hours prior to surgery.    Do not bring valuables to the hospital.  [facility] is not responsible for any belongings or valuables.  Contacts, dentures or bridgework may not be worn into surgery.  Leave your suitcase in the car.  After surgery it may be brought to your room.  For patients admitted to the hospital, discharge time will be determined by your treatment team.  Patients discharged the day of surgery will not be allowed to drive home.       Special instructions:  [place] - Preparing for Surgery  Before surgery, you can play an important role.  Because skin is not sterile, your skin needs to be as free of germs as possible.  You can reduce the number of germs on you skin by washing with CHG (chlorahexidine gluconate) soap before surgery.  CHG is an antiseptic cleaner which kills germs and bonds with the skin to continue killing germs even after washing.  Please DO NOT use if you have an allergy to CHG or antibacterial soaps.  If your skin becomes reddened/irritated stop using the CHG and inform your nurse when you arrive at Short Stay.  Do not shave (including legs and underarms) for at least 48 hours prior  to the first CHG shower.  You may shave your face.  Please follow these instructions carefully:   1.  Shower with CHG Soap the night before surgery and the                                morning of Surgery.  2.  If you choose to wash your hair, wash your hair first as usual with your       normal shampoo.  3.  After you shampoo, rinse your hair and body thoroughly to remove the                      Shampoo.  4.  Use CHG as you would any other liquid soap.  You can apply chg directly       to the skin and wash gently with scrungie or a clean washcloth.  5.  Apply the CHG Soap to your body ONLY FROM THE NECK DOWN.        Do not use on open wounds or open sores.  Avoid contact with your eyes,       ears, mouth and genitals (private parts).  Wash genitals (private parts)       with your normal soap.  6.  Wash thoroughly, paying special attention to the area where your surgery        will be performed.  7.  Thoroughly rinse your body with warm water from the neck down.  8.  DO NOT shower/wash with your normal soap after using and rinsing off       the CHG Soap.  9.  Pat yourself dry with a clean towel.            10.  Wear clean pajamas.            11.  Place clean sheets on your bed the night of your first shower and do not        sleep with pets.  Day of Surgery  Do not apply any lotions/deoderants the morning of surgery.  Please wear clean clothes to the hospital/surgery center.    Please read over the following fact sheets that you were given. Pain Booklet, Coughing and Deep Breathing, Blood Transfusion Information, MRSA Information and Surgical Site Infection Prevention

## 2015-07-23 LAB — URINE CULTURE

## 2015-07-23 LAB — HEMOGLOBIN A1C
HEMOGLOBIN A1C: 7.1 % — AB (ref 4.8–5.6)
MEAN PLASMA GLUCOSE: 157 mg/dL

## 2015-07-27 ENCOUNTER — Encounter: Payer: Self-pay | Admitting: Cardiovascular Disease

## 2015-07-27 ENCOUNTER — Ambulatory Visit (INDEPENDENT_AMBULATORY_CARE_PROVIDER_SITE_OTHER): Payer: Medicare Other | Admitting: Cardiovascular Disease

## 2015-07-27 VITALS — BP 138/80 | HR 100 | Ht 66.5 in | Wt 242.2 lb

## 2015-07-27 DIAGNOSIS — Z01818 Encounter for other preprocedural examination: Secondary | ICD-10-CM

## 2015-07-27 NOTE — Progress Notes (Signed)
Cardiology Office Note   Date:  07/27/2015   ID:  ZAKKIYYA BARNO, DOB 25-Jun-1946, MRN [id number]  PCP:  Irven Shelling, MD  Cardiologist:   Sharol Harness, MD   No chief complaint on file.    History of Present Illness: Natalie Riddle is a 69 y.o. female with DM (A1c 7.1 07/22/15), HTN, HL and OSA who presents for preoperative clearance for a knee surgery.  Natalie Riddle has a history of bilateral knee pain R>L.  More recently her knee started to catch and she feels as though it might fall.  She has been receiving cortisone injections and has now decided to pursue knee replacement in 6 days.  During her pre-operative appointment she was found to have a minor abnormality on EKG (possible anterior infarct), so she was referred to cardiology.    Until one month ago she was doing water aerobics regularly.  More recently she has not been able to exercise because she is unable to walk to the pool.  When was exercising, she did not get CP or SOB.  She is able to walk up more than a flgiht of stairs, though she would likely have to stop after one flight due to knee pain.  Natalie Riddle denies palpitations, chest pain, shortness of breath, lightheadedness, dizziness, orthopnea, and PND. She does note occasional I bilateral ankle edema after long plane or car rides.   She tries to eat relatively healthy, though her weaknesses are fatty foods and portion portion control.  She has OSA and wears her CPAP regularly.   Past Medical History  Diagnosis Date  . Hyperlipidemia     takes Atorvastatin daily  . Cancer   . Primary localized osteoarthritis of right knee   . Diabetes mellitus without complication     takes Metformin daily  . Hypertension     takes Benicar daily  . Glaucoma     uses eye drops daily and takes Diamox daily  . Joint pain   . Joint swelling   . History of gastric ulcer   . History of colon polyps     benign  . Diverticulosis   . Urinary  urgency   . Sleep apnea     uses CPAP  . Macular degeneration     dry    Past Surgical History  Procedure Laterality Date  . Cesarean section  41 yrs ago  . Tubal ligation  17yrs ago  . Laup    . Knee arthroscopy Right   . Colonoscopy    . Esophagogastroduodenoscopy    . Cartaract surgery Left      Current Outpatient Prescriptions  Medication Sig Dispense Refill  . acetaZOLAMIDE (DIAMOX) 500 MG capsule Take 500 mg by mouth daily.    Marland Kitchen aspirin 81 MG tablet Take 81 mg by mouth daily.    Marland Kitchen atorvastatin (LIPITOR) 20 MG tablet Take 20 mg by mouth daily.    . celecoxib (CELEBREX) 200 MG capsule Take 200 mg by mouth daily.    . Cholecalciferol (VITAMIN D) 2000 UNITS tablet Take 2,000 Units by mouth daily.    . Coenzyme Q10 (CO Q 10) 100 MG CAPS Take 1 capsule by mouth daily.    . cycloSPORINE (RESTASIS) 0.05 % ophthalmic emulsion Place 1 drop into both eyes 2 (two) times daily.    . Estradiol-Norethindrone Acet (ACTIVELLA) 0.5-0.1 MG per tablet Take 1 tablet by mouth every other day.    Marland Kitchen Hyaluronic Acid-Vitamin C (HYALURONIC ACID PO)  Take 300 mg by mouth daily.    . LUTEIN PO Take 1 capsule by mouth daily.    . metFORMIN (GLUCOPHAGE) 500 MG tablet Take 500 mg by mouth 2 (two) times daily with a meal.    . Misc Natural Products (OSTEO BI-FLEX JOINT SHIELD PO) Take 2 tablets by mouth daily. With MSM    . Multiple Vitamins-Minerals (MULTIVITAMIN WITH MINERALS) tablet Take 1 tablet by mouth daily.    . Multiple Vitamins-Minerals (PRESERVISION AREDS PO) Take 1 capsule by mouth daily.    Marland Kitchen olmesartan-hydrochlorothiazide (BENICAR HCT) 40-25 MG per tablet Take 1 tablet by mouth daily.    Marland Kitchen OVER THE COUNTER MEDICATION Take 2 capsules by mouth daily. Natural Joint    . Polyvinyl Alcohol (LIQUID TEARS OP) Apply 1 drop to eye daily as needed.    . TURMERIC PO Take 2,000 mg by mouth daily.    Marland Kitchen White Petrolatum-Mineral Oil (GENTEAL PM OP) Apply 1 drop to eye at bedtime. Preservative Free     No  current facility-administered medications for this visit.    Allergies:   Review of patient's allergies indicates no known allergies.    Social History:  The patient  reports that she has quit smoking. She does not have any smokeless tobacco history on file. She reports that she drinks alcohol.   Family History:  The patient's family history includes Heart disease in an other family member; Hypertension in an other family member.    ROS:  Please see the history of present illness.   Otherwise, review of systems are positive for none.   All other systems are reviewed and negative.    PHYSICAL EXAM: VS:  There were no vitals taken for this visit. , BMI There is no weight on file to calculate BMI. GENERAL:  Well appearing HEENT:  Pupils equal round and reactive, fundi not visualized, oral mucosa unremarkable NECK:  No jugular venous distention, waveform within normal limits, carotid upstroke brisk and symmetric, no bruits, no thyromegaly LYMPHATICS:  No cervical, inguinal adenopathy LUNGS:  Clear to auscultation bilaterally CHEST:  Unremarkable HEART:  PMI not displaced or sustained,S1 and S2 within normal limits, no S3, no S4, no clicks, no rubs, no murmurs ABD:  Flat, positive bowel sounds normal in frequency in pitch, no bruits, no rebound, no guarding, no midline pulsatile mass, no hepatomegaly, no splenomegaly EXT:  2 plus pulses throughout, no edema, no cyanosis no clubbing.  Bilateral knee swelling. SKIN:  No rashes no nodules NEURO:  Cranial nerves II through XII grossly intact, motor grossly intact throughout PSYCH:  Cognitively intact, oriented to person place and time   EKG:  EKG is ordered today. The ekg ordered today demonstrates sinus rhythm at 84 bpm.  7/28: Sinus rhythm 87bpm.  Low voltage.  L axis deviation.  Recent Labs: 07/22/2015: ALT 27; BUN 21*; Creatinine, Ser 0.83; Hemoglobin 12.7; Platelets 181; Potassium 3.7; Sodium 139    Lipid Panel No results found  for: CHOL, TRIG, HDL, CHOLHDL, VLDL, LDLCALC, LDLDIRECT    Wt Readings from Last 3 Encounters:  07/22/15 110.723 kg (244 lb 1.6 oz)  07/21/15 110.224 kg (243 lb)      Other studies Reviewed: Additional studies/ records that were reviewed today include: chart review Review of the above records demonstrates:  Please see elsewhere in the note.     ASSESSMENT AND PLAN:  # Presurgical evaluation: Natalie Riddle by her EKG shows somewhat low voltage and the computer printout reads possible anterior infarct. However  there is a small R wave prior to the S waves, indicating that these are not actually Q waves. Therefore I do not think that this is indicative of a prior MI. She does not have any symptoms of coronary disease.  The patient does not have any unstable cardiac conditions.  Upon evaluation today, she can achieve 4 METs or greater without anginal symptoms.  According to [facility] and AHA guidelines, she requires no further cardiac workup prior to her noncardiac surgery and should be at acceptable risk.  Our service is available as necessary in the perioperative period.    Current medicines are reviewed at length with the patient today.  The patient does not have concerns regarding medicines.  The following changes have been made:  no change  Labs/ tests ordered today include: n/a  No orders of the defined types were placed in this encounter.     Disposition:   FU with Dr. Oval Linsey prn   Signed, Sharol Harness, MD  07/27/2015 7:40 AM    [place]

## 2015-07-27 NOTE — Patient Instructions (Signed)
Your physician recommends that you schedule a follow-up appointment as needed with Dr. Casas  

## 2015-07-29 DIAGNOSIS — M25561 Pain in right knee: Secondary | ICD-10-CM | POA: Diagnosis not present

## 2015-07-29 DIAGNOSIS — M25361 Other instability, right knee: Secondary | ICD-10-CM | POA: Diagnosis not present

## 2015-07-29 DIAGNOSIS — R531 Weakness: Secondary | ICD-10-CM | POA: Diagnosis not present

## 2015-07-29 NOTE — Progress Notes (Signed)
Cardiac clearance noted in EPIC from Dr Oval Linsey dated 07/27/15

## 2015-08-02 ENCOUNTER — Encounter (HOSPITAL_COMMUNITY): Payer: Self-pay | Admitting: Surgery

## 2015-08-02 ENCOUNTER — Inpatient Hospital Stay (HOSPITAL_COMMUNITY): Payer: Medicare Other | Admitting: Vascular Surgery

## 2015-08-02 ENCOUNTER — Inpatient Hospital Stay (HOSPITAL_COMMUNITY): Payer: Medicare Other | Admitting: Certified Registered Nurse Anesthetist

## 2015-08-02 ENCOUNTER — Inpatient Hospital Stay (HOSPITAL_COMMUNITY)
Admission: RE | Admit: 2015-08-02 | Discharge: 2015-08-04 | DRG: 470 | Disposition: A | Discharge status: Home Health | Payer: Medicare Other | Source: Ambulatory Visit | Attending: Orthopedic Surgery | Admitting: Orthopedic Surgery

## 2015-08-02 ENCOUNTER — Encounter (HOSPITAL_COMMUNITY)
Admission: RE | Disposition: A | Discharge status: Home Health | Payer: Self-pay | Source: Ambulatory Visit | Attending: Orthopedic Surgery

## 2015-08-02 DIAGNOSIS — G4730 Sleep apnea, unspecified: Secondary | ICD-10-CM | POA: Diagnosis present

## 2015-08-02 DIAGNOSIS — M179 Osteoarthritis of knee, unspecified: Secondary | ICD-10-CM | POA: Diagnosis present

## 2015-08-02 DIAGNOSIS — H409 Unspecified glaucoma: Secondary | ICD-10-CM | POA: Diagnosis present

## 2015-08-02 DIAGNOSIS — Z8249 Family history of ischemic heart disease and other diseases of the circulatory system: Secondary | ICD-10-CM

## 2015-08-02 DIAGNOSIS — E119 Type 2 diabetes mellitus without complications: Secondary | ICD-10-CM | POA: Diagnosis present

## 2015-08-02 DIAGNOSIS — E669 Obesity, unspecified: Secondary | ICD-10-CM | POA: Diagnosis present

## 2015-08-02 DIAGNOSIS — M25561 Pain in right knee: Secondary | ICD-10-CM | POA: Diagnosis not present

## 2015-08-02 DIAGNOSIS — H3530 Unspecified macular degeneration: Secondary | ICD-10-CM | POA: Diagnosis present

## 2015-08-02 DIAGNOSIS — Z6839 Body mass index (BMI) 39.0-39.9, adult: Secondary | ICD-10-CM | POA: Diagnosis not present

## 2015-08-02 DIAGNOSIS — G8918 Other acute postprocedural pain: Secondary | ICD-10-CM | POA: Diagnosis not present

## 2015-08-02 DIAGNOSIS — E785 Hyperlipidemia, unspecified: Secondary | ICD-10-CM | POA: Diagnosis present

## 2015-08-02 DIAGNOSIS — I10 Essential (primary) hypertension: Secondary | ICD-10-CM | POA: Diagnosis present

## 2015-08-02 DIAGNOSIS — M1710 Unilateral primary osteoarthritis, unspecified knee: Secondary | ICD-10-CM | POA: Diagnosis present

## 2015-08-02 DIAGNOSIS — M1711 Unilateral primary osteoarthritis, right knee: Principal | ICD-10-CM | POA: Diagnosis present

## 2015-08-02 DIAGNOSIS — Z87891 Personal history of nicotine dependence: Secondary | ICD-10-CM

## 2015-08-02 HISTORY — DX: Unilateral primary osteoarthritis, right knee: M17.11

## 2015-08-02 HISTORY — PX: TOTAL KNEE ARTHROPLASTY: SHX125

## 2015-08-02 LAB — GLUCOSE, CAPILLARY
GLUCOSE-CAPILLARY: 150 mg/dL — AB (ref 65–99)
GLUCOSE-CAPILLARY: 157 mg/dL — AB (ref 65–99)
GLUCOSE-CAPILLARY: 195 mg/dL — AB (ref 65–99)
GLUCOSE-CAPILLARY: 237 mg/dL — AB (ref 65–99)
Glucose-Capillary: 211 mg/dL — ABNORMAL HIGH (ref 65–99)

## 2015-08-02 SURGERY — ARTHROPLASTY, KNEE, TOTAL
Anesthesia: Monitor Anesthesia Care | Site: Knee | Laterality: Right

## 2015-08-02 MED ORDER — CEFAZOLIN SODIUM-DEXTROSE 2-3 GM-% IV SOLR
2.0000 g | INTRAVENOUS | Status: AC
  Administered 2015-08-02: 2 g via INTRAVENOUS
  Filled 2015-08-02: qty 50

## 2015-08-02 MED ORDER — VITAMIN D 50 MCG (2000 UT) PO TABS: 2000.0000 [IU] | ORAL_TABLET | Freq: Every day | ORAL | Status: DC

## 2015-08-02 MED ORDER — DOCUSATE SODIUM 100 MG PO CAPS
100.0000 mg | ORAL_CAPSULE | Freq: Two times a day (BID) | ORAL | Status: DC
  Administered 2015-08-02 – 2015-08-04 (×5): 100 mg via ORAL
  Filled 2015-08-02 (×5): qty 1

## 2015-08-02 MED ORDER — PHENYLEPHRINE HCL 10 MG/ML IJ SOLN
INTRAMUSCULAR | Status: DC | PRN
  Administered 2015-08-02 (×3): 80 ug via INTRAVENOUS

## 2015-08-02 MED ORDER — SODIUM CHLORIDE 0.9 % IR SOLN
Status: DC | PRN
  Administered 2015-08-02: 3000 mL
  Administered 2015-08-02: 1000 mL

## 2015-08-02 MED ORDER — OLMESARTAN MEDOXOMIL-HCTZ 40-25 MG PO TABS: 1.0000 | ORAL_TABLET | Freq: Every day | ORAL | Status: DC

## 2015-08-02 MED ORDER — ACETAMINOPHEN 650 MG RE SUPP: 650.0000 mg | Freq: Four times a day (QID) | RECTAL | Status: DC | PRN

## 2015-08-02 MED ORDER — FENTANYL CITRATE (PF) 100 MCG/2ML IJ SOLN: 25.0000 ug | INTRAMUSCULAR | Status: DC | PRN

## 2015-08-02 MED ORDER — ROCURONIUM BROMIDE 50 MG/5ML IV SOLN
INTRAVENOUS | Status: AC
  Filled 2015-08-02: qty 1

## 2015-08-02 MED ORDER — BUPIVACAINE-EPINEPHRINE (PF) 0.25% -1:200000 IJ SOLN
INTRAMUSCULAR | Status: AC
  Filled 2015-08-02: qty 30

## 2015-08-02 MED ORDER — PHENOL 1.4 % MT LIQD: 1.0000 | OROMUCOSAL | Status: DC | PRN

## 2015-08-02 MED ORDER — OXYCODONE HCL 5 MG PO TABS: 5.0000 mg | ORAL_TABLET | Freq: Once | ORAL | Status: DC | PRN

## 2015-08-02 MED ORDER — POLYETHYLENE GLYCOL 3350 17 G PO PACK
17.0000 g | PACK | Freq: Two times a day (BID) | ORAL | Status: DC
  Administered 2015-08-02 – 2015-08-04 (×5): 17 g via ORAL
  Filled 2015-08-02 (×5): qty 1

## 2015-08-02 MED ORDER — CELECOXIB 200 MG PO CAPS
200.0000 mg | ORAL_CAPSULE | Freq: Two times a day (BID) | ORAL | Status: DC
  Administered 2015-08-02 – 2015-08-04 (×5): 200 mg via ORAL
  Filled 2015-08-02 (×5): qty 1

## 2015-08-02 MED ORDER — DIPHENHYDRAMINE HCL 12.5 MG/5ML PO ELIX: 12.5000 mg | ORAL_SOLUTION | ORAL | Status: DC | PRN

## 2015-08-02 MED ORDER — ONDANSETRON HCL 4 MG/2ML IJ SOLN
INTRAMUSCULAR | Status: DC | PRN
  Administered 2015-08-02: 4 mg via INTRAVENOUS

## 2015-08-02 MED ORDER — APIXABAN 2.5 MG PO TABS
2.5000 mg | ORAL_TABLET | Freq: Two times a day (BID) | ORAL | Status: DC
  Administered 2015-08-03 – 2015-08-04 (×3): 2.5 mg via ORAL
  Filled 2015-08-02 (×4): qty 1

## 2015-08-02 MED ORDER — HYDROCHLOROTHIAZIDE 25 MG PO TABS
25.0000 mg | ORAL_TABLET | Freq: Every day | ORAL | Status: DC
  Administered 2015-08-02 – 2015-08-04 (×3): 25 mg via ORAL
  Filled 2015-08-02 (×3): qty 1

## 2015-08-02 MED ORDER — ACETAMINOPHEN 325 MG PO TABS: 650.0000 mg | ORAL_TABLET | Freq: Four times a day (QID) | ORAL | Status: DC | PRN

## 2015-08-02 MED ORDER — FENTANYL CITRATE (PF) 100 MCG/2ML IJ SOLN
INTRAMUSCULAR | Status: DC | PRN
  Administered 2015-08-02 (×4): 50 ug via INTRAVENOUS

## 2015-08-02 MED ORDER — MIDAZOLAM HCL 2 MG/2ML IJ SOLN
INTRAMUSCULAR | Status: AC
  Filled 2015-08-02: qty 4

## 2015-08-02 MED ORDER — OXYCODONE HCL 5 MG PO TABS
5.0000 mg | ORAL_TABLET | ORAL | Status: DC | PRN
  Administered 2015-08-02 – 2015-08-04 (×9): 10 mg via ORAL
  Filled 2015-08-02 (×10): qty 2

## 2015-08-02 MED ORDER — LACTATED RINGERS IV SOLN
INTRAVENOUS | Status: DC
  Administered 2015-08-02 (×2): via INTRAVENOUS

## 2015-08-02 MED ORDER — FENTANYL CITRATE (PF) 250 MCG/5ML IJ SOLN
INTRAMUSCULAR | Status: AC
  Filled 2015-08-02: qty 5

## 2015-08-02 MED ORDER — PHENYLEPHRINE HCL 10 MG/ML IJ SOLN
10.0000 mg | INTRAVENOUS | Status: DC | PRN
  Administered 2015-08-02: 15 ug/min via INTRAVENOUS

## 2015-08-02 MED ORDER — MIDAZOLAM HCL 5 MG/5ML IJ SOLN
INTRAMUSCULAR | Status: DC | PRN
  Administered 2015-08-02: 2 mg via INTRAVENOUS

## 2015-08-02 MED ORDER — IRBESARTAN 300 MG PO TABS
300.0000 mg | ORAL_TABLET | Freq: Every day | ORAL | Status: DC
  Administered 2015-08-02 – 2015-08-04 (×3): 300 mg via ORAL
  Filled 2015-08-02 (×3): qty 1

## 2015-08-02 MED ORDER — POTASSIUM CHLORIDE IN NACL 20-0.9 MEQ/L-% IV SOLN
INTRAVENOUS | Status: DC
  Administered 2015-08-02 – 2015-08-03 (×2): via INTRAVENOUS
  Filled 2015-08-02 (×3): qty 1000

## 2015-08-02 MED ORDER — MENTHOL 3 MG MT LOZG: 1.0000 | LOZENGE | OROMUCOSAL | Status: DC | PRN

## 2015-08-02 MED ORDER — ALUM & MAG HYDROXIDE-SIMETH 200-200-20 MG/5ML PO SUSP: 30.0000 mL | ORAL | Status: DC | PRN

## 2015-08-02 MED ORDER — ONDANSETRON HCL 4 MG PO TABS
4.0000 mg | ORAL_TABLET | Freq: Four times a day (QID) | ORAL | Status: DC | PRN
  Administered 2015-08-02: 4 mg via ORAL
  Filled 2015-08-02: qty 1

## 2015-08-02 MED ORDER — INSULIN ASPART 100 UNIT/ML ~~LOC~~ SOLN
0.0000 [IU] | Freq: Every day | SUBCUTANEOUS | Status: DC
  Administered 2015-08-02: 2 [IU] via SUBCUTANEOUS

## 2015-08-02 MED ORDER — CEFAZOLIN SODIUM-DEXTROSE 2-3 GM-% IV SOLR
2.0000 g | Freq: Four times a day (QID) | INTRAVENOUS | Status: AC
  Administered 2015-08-02 (×2): 2 g via INTRAVENOUS
  Filled 2015-08-02 (×2): qty 50

## 2015-08-02 MED ORDER — ATORVASTATIN CALCIUM 10 MG PO TABS
20.0000 mg | ORAL_TABLET | Freq: Every day | ORAL | Status: DC
  Administered 2015-08-02 – 2015-08-03 (×2): 20 mg via ORAL
  Filled 2015-08-02 (×2): qty 2

## 2015-08-02 MED ORDER — ONDANSETRON HCL 4 MG/2ML IJ SOLN: 4.0000 mg | Freq: Four times a day (QID) | INTRAMUSCULAR | Status: DC | PRN

## 2015-08-02 MED ORDER — PROPOFOL 10 MG/ML IV BOLUS
INTRAVENOUS | Status: AC
  Filled 2015-08-02: qty 20

## 2015-08-02 MED ORDER — DEXAMETHASONE SODIUM PHOSPHATE 10 MG/ML IJ SOLN
10.0000 mg | Freq: Three times a day (TID) | INTRAMUSCULAR | Status: DC
  Administered 2015-08-02 – 2015-08-04 (×6): 10 mg via INTRAVENOUS
  Filled 2015-08-02 (×6): qty 1

## 2015-08-02 MED ORDER — VITAMIN D 1000 UNITS PO TABS
2000.0000 [IU] | ORAL_TABLET | Freq: Every day | ORAL | Status: DC
  Administered 2015-08-02 – 2015-08-04 (×3): 2000 [IU] via ORAL
  Filled 2015-08-02 (×3): qty 2

## 2015-08-02 MED ORDER — PROPOFOL INFUSION 10 MG/ML OPTIME
INTRAVENOUS | Status: DC | PRN
  Administered 2015-08-02: 100 ug/kg/min via INTRAVENOUS

## 2015-08-02 MED ORDER — METOCLOPRAMIDE HCL 5 MG/ML IJ SOLN: 5.0000 mg | Freq: Three times a day (TID) | INTRAMUSCULAR | Status: DC | PRN

## 2015-08-02 MED ORDER — ONDANSETRON HCL 4 MG/2ML IJ SOLN: 4.0000 mg | Freq: Once | INTRAMUSCULAR | Status: DC | PRN

## 2015-08-02 MED ORDER — METOCLOPRAMIDE HCL 5 MG PO TABS: 5.0000 mg | ORAL_TABLET | Freq: Three times a day (TID) | ORAL | Status: DC | PRN

## 2015-08-02 MED ORDER — LIDOCAINE HCL (CARDIAC) 20 MG/ML IV SOLN
INTRAVENOUS | Status: AC
  Filled 2015-08-02: qty 5

## 2015-08-02 MED ORDER — DEXAMETHASONE SODIUM PHOSPHATE 4 MG/ML IJ SOLN
INTRAMUSCULAR | Status: DC | PRN
  Administered 2015-08-02: 10 mg via INTRAVENOUS

## 2015-08-02 MED ORDER — INSULIN ASPART 100 UNIT/ML ~~LOC~~ SOLN
0.0000 [IU] | Freq: Three times a day (TID) | SUBCUTANEOUS | Status: DC
  Administered 2015-08-02: 7 [IU] via SUBCUTANEOUS
  Administered 2015-08-02: 4 [IU] via SUBCUTANEOUS
  Administered 2015-08-03 – 2015-08-04 (×4): 7 [IU] via SUBCUTANEOUS

## 2015-08-02 MED ORDER — BUPIVACAINE-EPINEPHRINE 0.25% -1:200000 IJ SOLN
INTRAMUSCULAR | Status: DC | PRN
  Administered 2015-08-02: 30 mL

## 2015-08-02 MED ORDER — CYCLOSPORINE 0.05 % OP EMUL
1.0000 [drp] | Freq: Two times a day (BID) | OPHTHALMIC | Status: DC
  Administered 2015-08-02 – 2015-08-04 (×4): 1 [drp] via OPHTHALMIC
  Filled 2015-08-02 (×7): qty 1

## 2015-08-02 MED ORDER — ACETAZOLAMIDE ER 500 MG PO CP12
500.0000 mg | ORAL_CAPSULE | Freq: Every day | ORAL | Status: DC
  Administered 2015-08-02 – 2015-08-04 (×3): 500 mg via ORAL
  Filled 2015-08-02 (×4): qty 1

## 2015-08-02 MED ORDER — OXYCODONE HCL 5 MG/5ML PO SOLN: 5.0000 mg | Freq: Once | ORAL | Status: DC | PRN

## 2015-08-02 MED ORDER — ONDANSETRON HCL 4 MG/2ML IJ SOLN
INTRAMUSCULAR | Status: AC
  Filled 2015-08-02: qty 2

## 2015-08-02 MED ORDER — HYDROMORPHONE HCL 1 MG/ML IJ SOLN: 1.0000 mg | INTRAMUSCULAR | Status: DC | PRN

## 2015-08-02 MED ORDER — INSULIN GLARGINE 100 UNIT/ML ~~LOC~~ SOLN
10.0000 [IU] | Freq: Every day | SUBCUTANEOUS | Status: DC
  Administered 2015-08-02: 10 [IU] via SUBCUTANEOUS
  Filled 2015-08-02: qty 0.1

## 2015-08-02 SURGICAL SUPPLY — 82 items
BANDAGE ESMARK 6X9 LF (GAUZE/BANDAGES/DRESSINGS) ×1 IMPLANT
BENZOIN TINCTURE PRP APPL 2/3 (GAUZE/BANDAGES/DRESSINGS) ×3 IMPLANT
BLADE SAGITTAL 25.0X1.19X90 (BLADE) ×2 IMPLANT
BLADE SAGITTAL 25.0X1.19X90MM (BLADE) ×1
BLADE SAW SGTL 11.0X1.19X90.0M (BLADE) IMPLANT
BLADE SAW SGTL 13.0X1.19X90.0M (BLADE) ×3 IMPLANT
BLADE SURG 10 STRL SS (BLADE) ×6 IMPLANT
BNDG ELASTIC 6X15 VLCR STRL LF (GAUZE/BANDAGES/DRESSINGS) ×3 IMPLANT
BNDG ESMARK 6X9 LF (GAUZE/BANDAGES/DRESSINGS) ×3
BOWL SMART MIX CTS (DISPOSABLE) ×3 IMPLANT
CAPT KNEE TOTAL 3 ATTUNE ×3 IMPLANT
CEMENT HV SMART SET (Cement) ×6 IMPLANT
CLOSURE WOUND 1/2 X4 (GAUZE/BANDAGES/DRESSINGS) ×1
COVER SURGICAL LIGHT HANDLE (MISCELLANEOUS) ×3 IMPLANT
CUFF TOURNIQUET SINGLE 34IN LL (TOURNIQUET CUFF) ×3 IMPLANT
CUFF TOURNIQUET SINGLE 44IN (TOURNIQUET CUFF) IMPLANT
DRAPE EXTREMITY T 121X128X90 (DRAPE) ×3 IMPLANT
DRAPE IMP U-DRAPE 54X76 (DRAPES) ×3 IMPLANT
DRAPE INCISE IOBAN 66X45 STRL (DRAPES) ×3 IMPLANT
DRAPE PROXIMA HALF (DRAPES) ×3 IMPLANT
DRAPE U-SHAPE 47X51 STRL (DRAPES) ×3 IMPLANT
DRSG AQUACEL AG ADV 3.5X14 (GAUZE/BANDAGES/DRESSINGS) ×3 IMPLANT
DRSG PAD ABDOMINAL 8X10 ST (GAUZE/BANDAGES/DRESSINGS) ×6 IMPLANT
DURAPREP 26ML APPLICATOR (WOUND CARE) ×6 IMPLANT
ELECT CAUTERY BLADE 6.4 (BLADE) ×3 IMPLANT
ELECT REM PT RETURN 9FT ADLT (ELECTROSURGICAL) ×3
ELECTRODE REM PT RTRN 9FT ADLT (ELECTROSURGICAL) ×1 IMPLANT
EVACUATOR 1/8 PVC DRAIN (DRAIN) ×3 IMPLANT
FACESHIELD WRAPAROUND (MASK) ×3 IMPLANT
GAUZE SPONGE 4X4 12PLY STRL (GAUZE/BANDAGES/DRESSINGS) ×3 IMPLANT
GLOVE BIO SURGEON STRL SZ 6.5 (GLOVE) ×2 IMPLANT
GLOVE BIO SURGEON STRL SZ7 (GLOVE) ×3 IMPLANT
GLOVE BIO SURGEONS STRL SZ 6.5 (GLOVE) ×1
GLOVE BIOGEL PI IND STRL 6.5 (GLOVE) ×2 IMPLANT
GLOVE BIOGEL PI IND STRL 7.0 (GLOVE) ×1 IMPLANT
GLOVE BIOGEL PI IND STRL 7.5 (GLOVE) ×1 IMPLANT
GLOVE BIOGEL PI INDICATOR 6.5 (GLOVE) ×4
GLOVE BIOGEL PI INDICATOR 7.0 (GLOVE) ×2
GLOVE BIOGEL PI INDICATOR 7.5 (GLOVE) ×2
GLOVE BIOGEL PI ORTHO PRO SZ7 (GLOVE) ×2
GLOVE BIOGEL PI ORTHO PRO SZ8 (GLOVE) ×2
GLOVE PI ORTHO PRO STRL SZ7 (GLOVE) ×1 IMPLANT
GLOVE PI ORTHO PRO STRL SZ8 (GLOVE) ×1 IMPLANT
GLOVE SS BIOGEL STRL SZ 6.5 (GLOVE) ×1 IMPLANT
GLOVE SS BIOGEL STRL SZ 7.5 (GLOVE) ×3 IMPLANT
GLOVE SUPERSENSE BIOGEL SZ 6.5 (GLOVE) ×2
GLOVE SUPERSENSE BIOGEL SZ 7.5 (GLOVE) ×6
GLOVE SURG ORTHO 8.0 STRL STRW (GLOVE) ×3 IMPLANT
GLOVE SURG SS PI 7.0 STRL IVOR (GLOVE) ×3 IMPLANT
GOWN STRL REUS W/ TWL LRG LVL3 (GOWN DISPOSABLE) ×2 IMPLANT
GOWN STRL REUS W/ TWL XL LVL3 (GOWN DISPOSABLE) ×2 IMPLANT
GOWN STRL REUS W/TWL LRG LVL3 (GOWN DISPOSABLE) ×4
GOWN STRL REUS W/TWL XL LVL3 (GOWN DISPOSABLE) ×4
HANDPIECE INTERPULSE COAX TIP (DISPOSABLE) ×2
HOOD PEEL AWAY FACE SHEILD DIS (HOOD) ×9 IMPLANT
IMMOBILIZER KNEE 22 UNIV (SOFTGOODS) ×3 IMPLANT
KIT BASIN OR (CUSTOM PROCEDURE TRAY) ×3 IMPLANT
KIT ROOM TURNOVER OR (KITS) ×3 IMPLANT
MANIFOLD NEPTUNE II (INSTRUMENTS) ×3 IMPLANT
MARKER SKIN DUAL TIP RULER LAB (MISCELLANEOUS) ×3 IMPLANT
NEEDLE SPNL 22GX3.5 QUINCKE BK (NEEDLE) ×3 IMPLANT
NS IRRIG 1000ML POUR BTL (IV SOLUTION) ×3 IMPLANT
PACK TOTAL JOINT (CUSTOM PROCEDURE TRAY) ×3 IMPLANT
PACK UNIVERSAL I (CUSTOM PROCEDURE TRAY) ×3 IMPLANT
PAD ARMBOARD 7.5X6 YLW CONV (MISCELLANEOUS) ×6 IMPLANT
PADDING CAST COTTON 6X4 STRL (CAST SUPPLIES) ×3 IMPLANT
RUBBERBAND STERILE (MISCELLANEOUS) ×3 IMPLANT
SET HNDPC FAN SPRY TIP SCT (DISPOSABLE) ×1 IMPLANT
SPONGE GAUZE 4X4 12PLY STER LF (GAUZE/BANDAGES/DRESSINGS) ×3 IMPLANT
STRIP CLOSURE SKIN 1/2X4 (GAUZE/BANDAGES/DRESSINGS) ×2 IMPLANT
SUCTION FRAZIER TIP 10 FR DISP (SUCTIONS) ×3 IMPLANT
SUT ETHIBOND NAB CT1 #1 30IN (SUTURE) ×3 IMPLANT
SUT MNCRL AB 3-0 PS2 18 (SUTURE) ×3 IMPLANT
SUT VIC AB 0 CT1 27 (SUTURE) ×4
SUT VIC AB 0 CT1 27XBRD ANBCTR (SUTURE) ×2 IMPLANT
SUT VIC AB 2-0 CT1 27 (SUTURE) ×4
SUT VIC AB 2-0 CT1 TAPERPNT 27 (SUTURE) ×2 IMPLANT
SYR 30ML SLIP (SYRINGE) ×3 IMPLANT
TOWEL OR 17X24 6PK STRL BLUE (TOWEL DISPOSABLE) ×3 IMPLANT
TOWEL OR 17X26 10 PK STRL BLUE (TOWEL DISPOSABLE) ×3 IMPLANT
TRAY FOLEY CATH 16FR SILVER (SET/KITS/TRAYS/PACK) ×3 IMPLANT
WATER STERILE IRR 1000ML POUR (IV SOLUTION) ×6 IMPLANT

## 2015-08-02 NOTE — Anesthesia Postprocedure Evaluation (Signed)
  Anesthesia Post-op Note  Patient: Natalie Riddle  Procedure(s) Performed: Procedure(s): TOTAL KNEE ARTHROPLASTY (Right)  Patient Location: PACU  Anesthesia Type:MAC, Spinal and MAC combined with regional for post-op pain  Level of Consciousness: awake, alert  and oriented  Airway and Oxygen Therapy: Patient Spontanous Breathing and Patient connected to nasal cannula oxygen  Post-op Pain: mild  Post-op Assessment: Post-op Vital signs reviewed, Patient's Cardiovascular Status Stable, Respiratory Function Stable, Patent Airway, No signs of Nausea or vomiting and Pain level controlled LLE Motor Response: Purposeful movement, Responds to commands LLE Sensation: Numbness RLE Motor Response: Purposeful movement, Responds to commands RLE Sensation: Numbness L Sensory Level: S1-Sole of foot, small toes R Sensory Level: S1-Sole of foot, small toes  Post-op Vital Signs: stable  Last Vitals:  Filed Vitals:   08/02/15 1331  BP: 115/60  Pulse: 95  Temp: 36.8 C  Resp: 18    Complications: No apparent anesthesia complications

## 2015-08-02 NOTE — Anesthesia Preprocedure Evaluation (Addendum)
Anesthesia Evaluation  Patient identified by MRN, date of birth, ID band Patient awake    Reviewed: Allergy & Precautions, NPO status , Patient's Chart, lab work & pertinent test results  Airway Mallampati: II  TM Distance: >3 FB Neck ROM: Full    Dental  (+) Teeth Intact, Dental Advisory Given   Pulmonary sleep apnea and Continuous Positive Airway Pressure Ventilation , former smoker,  breath sounds clear to auscultation        Cardiovascular hypertension, Rhythm:Regular Rate:Normal     Neuro/Psych    GI/Hepatic   Endo/Other  diabetes, Type 2, Oral Hypoglycemic Agents  Renal/GU      Musculoskeletal   Abdominal (+) + obese,   Peds  Hematology   Anesthesia Other Findings   Reproductive/Obstetrics                            Anesthesia Physical Anesthesia Plan  ASA: III  Anesthesia Plan: MAC and Spinal   Post-op Pain Management: MAC Combined w/ Regional for Post-op pain   Induction:   Airway Management Planned: Natural Airway and Simple Face Mask  Additional Equipment:   Intra-op Plan:   Post-operative Plan:   Informed Consent: I have reviewed the patients History and Physical, chart, labs and discussed the procedure including the risks, benefits and alternatives for the proposed anesthesia with the patient or authorized representative who has indicated his/her understanding and acceptance.   Dental advisory given  Plan Discussed with: CRNA, Anesthesiologist and Surgeon  Anesthesia Plan Comments: (DJD R. Knee Type 2 DM glucose 150 Hypertension  Plan SAB with ACB  Roberts Gaudy)       Anesthesia Quick Evaluation

## 2015-08-02 NOTE — Transfer of Care (Signed)
Immediate Anesthesia Transfer of Care Note  Patient: Natalie Riddle  Procedure(s) Performed: Procedure(s): TOTAL KNEE ARTHROPLASTY (Right)  Patient Location: PACU  Anesthesia Type:MAC and Spinal  Level of Consciousness: awake, alert  and oriented  Airway & Oxygen Therapy: Patient Spontanous Breathing and Patient connected to nasal cannula oxygen  Post-op Assessment: Report given to RN and Post -op Vital signs reviewed and stable  Post vital signs: Reviewed and stable  Last Vitals:  Filed Vitals:   08/02/15 0933  BP:   Pulse: 99  Temp: 36.4 C  Resp:     Complications: No apparent anesthesia complications

## 2015-08-02 NOTE — Interval H&P Note (Signed)
History and Physical Interval Note:  08/02/2015 7:06 AM  Natalie Riddle  has presented today for surgery, with the diagnosis of PRIMARY LOCALIZED OA RIGHT KNEE  The various methods of treatment have been discussed with the patient and family. After consideration of risks, benefits and other options for treatment, the patient has consented to  Procedure(s): TOTAL KNEE ARTHROPLASTY (Right) as a surgical intervention .  The patient's history has been reviewed, patient examined, no change in status, stable for surgery.  I have reviewed the patient's chart and labs.  Questions were answered to the patient's satisfaction.     Elsie Saas A

## 2015-08-02 NOTE — Evaluation (Signed)
Physical Therapy Evaluation Patient Details Name: Natalie Riddle MRN: [id number] DOB: [birth year] Today's Date: 08/02/2015   History of Present Illness  Rt TKA  Clinical Impression  Pt is s/p TKA resulting in the deficits listed below (see PT Problem List). Pt will benefit from skilled PT to increase their independence and safety with mobility to allow discharge to the venue listed below.      Follow Up Recommendations Home health PT    Equipment Recommendations  Other (comment) (reports having RW at home)    Recommendations for Other Services       Precautions / Restrictions Precautions Precautions: Knee;Fall Required Braces or Orthoses: Knee Immobilizer - Right Knee Immobilizer - Right: Other (comment) (not specified) Restrictions Weight Bearing Restrictions: Yes RLE Weight Bearing: Weight bearing as tolerated      Mobility  Bed Mobility Overal bed mobility: Needs Assistance Bed Mobility: Supine to Sit     Supine to sit: Min assist     General bed mobility comments: Rt LE  Transfers Overall transfer level: Needs assistance Equipment used: Rolling walker (2 wheeled) Transfers: Sit to/from Stand Sit to Stand: Mod assist            Ambulation/Gait Ambulation/Gait assistance: Min assist Ambulation Distance (Feet): 7 Feet Assistive device: Rolling walker (2 wheeled) Gait Pattern/deviations: Step-to pattern;Decreased weight shift to right Gait velocity: decreased      Stairs            Wheelchair Mobility    Modified Rankin (Stroke Patients Only)       Balance Overall balance assessment: Needs assistance Sitting-balance support: No upper extremity supported Sitting balance-Leahy Scale: Good     Standing balance support: Bilateral upper extremity supported Standing balance-Leahy Scale: Poor                               Pertinent Vitals/Pain Pain Assessment: Faces Faces Pain Scale: Hurts little more Pain Location:  Rt knee Pain Descriptors / Indicators: Aching Pain Intervention(s): Monitored during session    Home Living Family/patient expects to be discharged to:: Private residence Living Arrangements: Alone Available Help at Discharge: Family (son will stay with) Type of Home: House Home Access: Stairs to enter Entrance Stairs-Rails: Right Entrance Stairs-Number of Steps: 4 to enter, 15 inside Home Layout: Two level Home Equipment: Walker - 2 wheels;Cane - single point      Prior Function Level of Independence: Independent with assistive device(s)         Comments: using cane prior to surgery     Hand Dominance        Extremity/Trunk Assessment               Lower Extremity Assessment: RLE deficits/detail RLE Deficits / Details: unable to perform SLR independently       Communication   Communication: No difficulties  Cognition Arousal/Alertness: Awake/alert Behavior During Therapy: WFL for tasks assessed/performed Overall Cognitive Status: Within Functional Limits for tasks assessed                      General Comments      Exercises        Assessment/Plan    PT Assessment Patient needs continued PT services  PT Diagnosis Difficulty walking;Acute pain   PT Problem List Decreased strength;Decreased range of motion;Decreased activity tolerance;Decreased balance;Decreased mobility;Decreased knowledge of use of DME  PT Treatment Interventions DME instruction;Gait training;Stair training;Functional mobility training;Therapeutic activities;Therapeutic exercise;Balance  training   PT Goals (Current goals can be found in the Care Plan section) Acute Rehab PT Goals Patient Stated Goal: Get home PT Goal Formulation: With patient Time For Goal Achievement: 08/16/15 Potential to Achieve Goals: Good    Frequency 7X/week   Barriers to discharge        Co-evaluation               End of Session Equipment Utilized During Treatment: Gait belt;Right  knee immobilizer Activity Tolerance: Patient tolerated treatment well Patient left: in chair;with call bell/phone within reach;Other (comment) (in bone foam) Nurse Communication: Mobility status         Time: 1438-1510 PT Time Calculation (min) (ACUTE ONLY): 32 min   Charges:   PT Evaluation $Initial PT Evaluation Tier I: 1 Procedure PT Treatments $Therapeutic Activity: 8-22 mins   PT G Codes:       Cassell Clement, PT, CSCS Pager [phone] Office [phone]   08/02/2015, 4:07 PM

## 2015-08-02 NOTE — Progress Notes (Signed)
Utilization review completed.  

## 2015-08-02 NOTE — Anesthesia Procedure Notes (Addendum)
Spinal Patient location during procedure: OR Start time: 08/02/2015 7:25 AM End time: 08/02/2015 7:30 AM Staffing Performed by: anesthesiologist  Preanesthetic Checklist Completed: patient identified, site marked, surgical consent, pre-op evaluation, timeout performed, IV checked, risks and benefits discussed and monitors and equipment checked Spinal Block Patient position: right lateral decubitus Prep: ChloraPrep Patient monitoring: heart rate, cardiac monitor, continuous pulse ox and blood pressure Approach: right paramedian Location: L3-4 Injection technique: single-shot Needle Needle type: Tuohy  Needle gauge: 22 G Needle length: 9 cm Needle insertion depth: 6 cm Assessment Sensory level: T6 Additional Notes 10 mg bupivacaine 0.75% injected easily  Anesthesia Regional Block:  Adductor canal block  Pre-Anesthetic Checklist: ,, timeout performed, Correct Patient, Correct Site, Correct Laterality, Correct Procedure, Correct Position, site marked, Risks and benefits discussed,  Surgical consent,  Pre-op evaluation,  At surgeon's request and post-op pain management  Laterality: Right  Prep: chloraprep       Needles:  Injection technique: Single-shot  Needle Type: Echogenic Stimulator Needle     Needle Length: 9cm 9 cm Needle Gauge: 22 and 22 G    Additional Needles:  Procedures: ultrasound guided (picture in chart) Adductor canal block Narrative:  Start time: 08/02/2015 7:00 AM End time: 08/02/2015 7:05 AM Injection made incrementally with aspirations every 5 mL.  Performed by: Personally

## 2015-08-02 NOTE — Progress Notes (Signed)
Orthopedic Tech Progress Note Patient Details:  Natalie Riddle June 14, 1946 [id number]  CPM Right Knee CPM Right Knee: On Right Knee Flexion (Degrees): 90 Right Knee Extension (Degrees): 0 Additional Comments: Trapeze bar and foot roll   Cammer, Theodoro Parma 08/02/2015, 10:15 AM

## 2015-08-02 NOTE — Op Note (Signed)
MRN:     [id number] DOB/AGE:    03-04-1946 / 69 y.o.       OPERATIVE REPORT    DATE OF PROCEDURE:  08/02/2015       PREOPERATIVE DIAGNOSIS:   PRIMARY LOCALIZED OA RIGHT KNEE      Estimated body mass index is 39.24 kg/(m^2) as calculated from the following:   Height as of this encounter: 5\' 6"  (1.676 m).   Weight as of this encounter: 110.224 kg (243 lb).                                                        POSTOPERATIVE DIAGNOSIS:   SAME                                                                      PROCEDURE:  Procedure(s): TOTAL KNEE ARTHROPLASTY Using Depuy Attune RP implants #5 Femur, #5Tibia, 61mm attune RP bearing, 32 Patella     SURGEON: Skyleen Bentley A    ASSISTANT:  Kirstin Shepperson PA-C   (Present and scrubbed throughout the case, critical for assistance with exposure, retraction, instrumentation, and closure.)         ANESTHESIA: Spinal with Femoral Nerve Block  DRAINS: foley, 2 medium hemovac in knee   TOURNIQUET TIME: 72ZDG   COMPLICATIONS:  None     SPECIMENS: None   INDICATIONS FOR PROCEDURE: The patient has  DJD RIGHT KNEE, varus deformities, XR shows bone on bone arthritis. Patient has failed all conservative measures including anti-inflammatory medicines, narcotics, attempts at  exercise and weight loss, cortisone injections and viscosupplementation.  Risks and benefits of surgery have been discussed, questions answered.   DESCRIPTION OF PROCEDURE: The patient identified by armband, received  right femoral nerve block and IV antibiotics, in the holding area at [facility]. Patient taken to the operating room, appropriate anesthetic  monitors were attached General endotracheal anesthesia induced with  the patient in supine position, Foley catheter was inserted. Tourniquet  applied high to the operative thigh. Lateral post and foot positioner  applied to the table, the lower extremity was then prepped and draped  in usual sterile fashion from the  ankle to the tourniquet. Time-out procedure was performed. The limb was wrapped with an Esmarch bandage and the tourniquet inflated to 365 mmHg. We began the operation by making the anterior midline incision starting at handbreadth above the patella going over the patella 1 cm medial to and  4 cm distal to the tibial tubercle. Small bleeders in the skin and the  subcutaneous tissue identified and cauterized. Transverse retinaculum was incised and reflected medially and a medial parapatellar arthrotomy was accomplished. the patella was everted and theprepatellar fat pad resected. The superficial medial collateral  ligament was then elevated from anterior to posterior along the proximal  flare of the tibia and anterior half of the menisci resected. The knee was hyperflexed exposing bone on bone arthritis. Peripheral and notch osteophytes as well as the cruciate ligaments were then resected. We continued to  work our way around posteriorly along the proximal tibia, and externally  rotated the tibia subluxing it out from underneath the femur. A McHale  retractor was placed through the notch and a lateral Hohmann retractor  placed, and we then drilled through the proximal tibia in line with the  axis of the tibia followed by an intramedullary guide rod and 2-degree  posterior slope cutting guide. The tibial cutting guide was pinned into place  allowing resection of 4 mm of bone medially and about 6 mm of bone  laterally because of her varus deformity. Satisfied with the tibial resection, we then  entered the distal femur 2 mm anterior to the PCL origin with the  intramedullary guide rod and applied the distal femoral cutting guide  set at 38mm, with 5 degrees of valgus. This was pinned along the  epicondylar axis. At this point, the distal femoral cut was accomplished without difficulty. We then sized for a #5 femoral component and pinned the guide in 3 degrees of external rotation.The chamfer cutting  guide was pinned into place. The anterior, posterior, and chamfer cuts were accomplished without difficulty followed by  the Sigma RP box cutting guide and the box cut. We also removed posterior osteophytes from the posterior femoral condyles. At this  time, the knee was brought into full extension. We checked our  extension and flexion gaps and found them symmetric at 16mm.  The patella thickness measured at 22 mm. We set the cutting guide at 13 and removed the posterior 9.5-10 mm  of the patella sized for 32 button and drilled the lollipop. The knee  was then once again hyperflexed exposing the proximal tibia. We sized for a #5 tibial base plate, applied the smokestack and the conical reamer followed by the the Delta fin keel punch. We then hammered into place the Attune RP trial femoral component, inserted a 7-mm trial bearing, trial patellar button, and took the knee through range of motion from 0-130 degrees. No thumb pressure was required for patellar  tracking. At this point, all trial components were removed, a double batch of DePuy HV cement  was mixed and applied to all bony metallic mating surfaces except for the posterior condyles of the femur itself. In order, we  hammered into place the tibial tray and removed excess cement, the femoral component and removed excess cement, a 7-mm Attune RP bearing  was inserted, and the knee brought to full extension with compression.  The patellar button was clamped into place, and excess cement  removed. While the cement cured the wound was irrigated out with normal saline solution pulse lavage, and medium Hemovac drains were placed.. Ligament stability and patellar tracking were checked and found to be excellent. The tourniquet was then released and hemostasis was obtained with cautery. The parapatellar arthrotomy was closed with  #1 ethibond suture. The subcutaneous tissue with 0 and 2-0 undyed  Vicryl suture, and 4-0 Monocryl.. A dressing of Xeroform,   4 x 4, dressing sponges, Webril, and Ace wrap applied. Needle and sponge count were correct times 2.The patient awakened, extubated, and taken to recovery room without difficulty. Vascular status was normal, pulses 2+ and symmetric.   Renzo Vincelette A 08/02/2015, 9:03 AM

## 2015-08-03 ENCOUNTER — Encounter (HOSPITAL_COMMUNITY): Payer: Self-pay | Admitting: Orthopedic Surgery

## 2015-08-03 LAB — CBC
HEMATOCRIT: 32.6 % — AB (ref 36.0–46.0)
Hemoglobin: 10.9 g/dL — ABNORMAL LOW (ref 12.0–15.0)
MCH: 31.1 pg (ref 26.0–34.0)
MCHC: 33.4 g/dL (ref 30.0–36.0)
MCV: 92.9 fL (ref 78.0–100.0)
Platelets: 188 10*3/uL (ref 150–400)
RBC: 3.51 MIL/uL — AB (ref 3.87–5.11)
RDW: 13.1 % (ref 11.5–15.5)
WBC: 12 10*3/uL — ABNORMAL HIGH (ref 4.0–10.5)

## 2015-08-03 LAB — GLUCOSE, CAPILLARY
GLUCOSE-CAPILLARY: 219 mg/dL — AB (ref 65–99)
Glucose-Capillary: 235 mg/dL — ABNORMAL HIGH (ref 65–99)
Glucose-Capillary: 211 mg/dL — ABNORMAL HIGH (ref 65–99)
Glucose-Capillary: 189 mg/dL — ABNORMAL HIGH (ref 65–99)

## 2015-08-03 LAB — BASIC METABOLIC PANEL
Anion gap: 9 (ref 5–15)
BUN: 15 mg/dL (ref 6–20)
CO2: 20 mmol/L — ABNORMAL LOW (ref 22–32)
Calcium: 8.6 mg/dL — ABNORMAL LOW (ref 8.9–10.3)
Chloride: 107 mmol/L (ref 101–111)
Creatinine, Ser: 0.87 mg/dL (ref 0.44–1.00)
GFR calc Af Amer: >60 mL/min (ref >60)
GFR calc non Af Amer: >60 mL/min (ref >60)
Glucose, Bld: 240 mg/dL — ABNORMAL HIGH (ref 65–99)
Potassium: 3.7 mmol/L (ref 3.5–5.1)
Sodium: 136 mmol/L (ref 135–145)

## 2015-08-03 MED ORDER — SODIUM CHLORIDE 0.9 % IJ SOLN
3.0000 mL | Freq: Two times a day (BID) | INTRAMUSCULAR | Status: DC
  Administered 2015-08-03 (×2): 3 mL via INTRAVENOUS

## 2015-08-03 MED ORDER — SODIUM CHLORIDE 0.9 % IJ SOLN: 3.0000 mL | INTRAMUSCULAR | Status: DC | PRN

## 2015-08-03 MED ORDER — INSULIN GLARGINE 100 UNIT/ML ~~LOC~~ SOLN
15.0000 [IU] | Freq: Every day | SUBCUTANEOUS | Status: DC
  Administered 2015-08-03: 15 [IU] via SUBCUTANEOUS
  Filled 2015-08-03 (×2): qty 0.15

## 2015-08-03 NOTE — Discharge Instructions (Signed)

## 2015-08-03 NOTE — Care Management Note (Signed)
Case Management Note  Patient Details  Name: Natalie Riddle MRN: [id number] Date of Birth: September 13, 1946  Subjective/Objective:           S/p right total knee arthroplasty         Action/Plan: Set up with [facility] for HHPT by MD office. Spoke with patient, no change in discharge plan. [employer] has delivered CPM, rolling walker and 3N1 to patient's home. Patient states that her son will be staying with her to assist after discharge.   Expected Discharge Date:                  Expected Discharge Plan:  [place]  In-House Referral:  NA  Discharge planning Services  CM Consult  Post Acute Care Choice:  Durable Medical Equipment, Home Health Choice offered to:  Patient  DME Arranged:  3-N-1, CPM, Walker rolling DME Agency:  [employer]  HH Arranged:  PT HH Agency:  [facility]  Status of Service:  Completed, signed off  Medicare Important Message Given:    Date Medicare IM Given:    Medicare IM give by:    Date Additional Medicare IM Given:    Additional Medicare Important Message give by:     If discussed at [place] of Stay Meetings, dates discussed:    Additional Comments:  Nila Nephew, RN 08/03/2015, 11:41 AM

## 2015-08-03 NOTE — Progress Notes (Signed)
Subjective: 1 Day Post-Op Procedure(s) (LRB): TOTAL KNEE ARTHROPLASTY (Right) Patient reports pain as 4 on 0-10 scale.    Objective: Vital signs in last 24 hours: Temp:  [98 F (36.7 C)-98.4 F (36.9 C)] 98.1 F (36.7 C) (08/09 0634) Pulse Rate:  [81-95] 81 (08/09 0634) Resp:  [16-23] 18 (08/09 0634) BP: (108-121)/(51-62) 113/61 mmHg (08/09 0634) SpO2:  [92 %-95 %] 94 % (08/09 0634)  Intake/Output from previous day: 08/08 0701 - 08/09 0700 In: 1915 [P.O.:600; I.V.:1100] Out: 1630 [Urine:1400; Drains:130; Blood:100] Intake/Output this shift:     Recent Labs  08/03/15 0823  HGB 10.9*    Recent Labs  08/03/15 0823  WBC 12.0*  RBC 3.51*  HCT 32.6*  PLT 188   No results for input(s): NA, K, CL, CO2, BUN, CREATININE, GLUCOSE, CALCIUM in the last 72 hours. No results for input(s): LABPT, INR in the last 72 hours.  ABD soft Neurovascular intact Sensation intact distally Intact pulses distally Dorsiflexion/Plantar flexion intact Incision: dressing C/D/I  Assessment/Plan: 1 Day Post-Op Procedure(s) (LRB): TOTAL KNEE ARTHROPLASTY (Right)  Principal Problem:   Primary localized osteoarthritis of right knee Active Problems:   Diabetes mellitus without complication   Hypertension   Hyperlipidemia   Obesity   Sleep apnea   DJD (degenerative joint disease) of knee  Advance diet Up with therapy Plan for discharge tomorrow Discharge home with home health  Linda Hedges 08/03/2015, 9:44 AM

## 2015-08-03 NOTE — Progress Notes (Signed)
Physical Therapy Treatment Patient Details Name: GWENDOLYN MCLEES MRN: [id number] DOB: [birth year] Today's Date: 08/03/2015    History of Present Illness Rt TKA    PT Comments    Patient is making good progress with PT.  From a mobility standpoint anticipate patient will be ready for DC home. Patient denies any questions or concerns after session.      Follow Up Recommendations  Home health PT     Equipment Recommendations  Other (comment)    Recommendations for Other Services       Precautions / Restrictions Precautions Precautions: Knee;Fall Restrictions Weight Bearing Restrictions: Yes RLE Weight Bearing: Weight bearing as tolerated    Mobility  Bed Mobility Overal bed mobility: Needs Assistance Bed Mobility: Supine to Sit     Supine to sit: Supervision        Transfers Overall transfer level: Needs assistance Equipment used: Rolling walker (2 wheeled) Transfers: Sit to/from Stand Sit to Stand: Supervision            Ambulation/Gait Ambulation/Gait assistance: Supervision Ambulation Distance (Feet): 300 Feet Assistive device: Rolling walker (2 wheeled) Gait Pattern/deviations: Step-through pattern Gait velocity: decreased       Stairs Stairs: Yes Stairs assistance: Supervision Stair Management: One rail Right;Sideways Number of Stairs: 12    Wheelchair Mobility    Modified Rankin (Stroke Patients Only)       Balance Overall balance assessment: Needs assistance Sitting-balance support: No upper extremity supported Sitting balance-Leahy Scale: Normal     Standing balance support: No upper extremity supported Standing balance-Leahy Scale: Fair                      Cognition Arousal/Alertness: Awake/alert Behavior During Therapy: WFL for tasks assessed/performed Overall Cognitive Status: Within Functional Limits for tasks assessed                      Exercises Total Joint Exercises Ankle Circles/Pumps:  AROM;Both;20 reps Quad Sets: Right;10 reps;Strengthening Heel Slides: AAROM;Right;10 reps Hip ABduction/ADduction: AAROM;Right;10 reps Straight Leg Raises: Right;10 reps;Strengthening Goniometric ROM: approx., 80 degrees flexion    General Comments        Pertinent Vitals/Pain Pain Assessment: No/denies pain Pain Intervention(s): Monitored during session    Home Living                      Prior Function            PT Goals (current goals can now be found in the care plan section) Acute Rehab PT Goals Patient Stated Goal: Get home PT Goal Formulation: With patient Time For Goal Achievement: 08/16/15 Potential to Achieve Goals: Good Progress towards PT goals: Progressing toward goals    Frequency  7X/week    PT Plan Current plan remains appropriate    Co-evaluation             End of Session Equipment Utilized During Treatment: Gait belt Activity Tolerance: Patient tolerated treatment well Patient left: in chair;with call bell/phone within reach;Other (comment) (in bone foam)     Time: 1451-1520 PT Time Calculation (min) (ACUTE ONLY): 29 min  Charges:  $Gait Training: 8-22 mins $Therapeutic Exercise: 8-22 mins                    G Codes:      Cassell Clement, PT, CSCS Pager [phone] Office [phone]  08/03/2015, 3:26 PM

## 2015-08-03 NOTE — Evaluation (Signed)
Occupational Therapy Evaluation Patient Details Name: Natalie Riddle MRN: [id number] DOB: 1946-07-17 Today's Date: 08/03/2015    History of Present Illness Rt TKA   Clinical Impression   Patient presenting with deconditioning and decreased ADL and functional mobility independence secondary to above. Patient mod I PTA. Patient currently functioning at a supervision>min assist level (needing assistance with LB ADLs). Patient will benefit from acute OT to increase overall independence in the areas of ADLs, functional mobility, and overall safety in order to safely discharge home with assistance from son.      Follow Up Recommendations  No OT follow up;Supervision - Intermittent    Equipment Recommendations  Other (comment) (LH sponge, reacher)    Recommendations for Other Services  None at this time   Precautions / Restrictions Precautions Precautions: Knee;Fall Required Braces or Orthoses: Knee Immobilizer - Right Knee Immobilizer - Right: Other (comment) (not specified) Restrictions Weight Bearing Restrictions: Yes RLE Weight Bearing: Weight bearing as tolerated    Mobility Bed Mobility Overal bed mobility: Needs Assistance Bed Mobility: Supine to Sit     Supine to sit: Supervision;HOB elevated     General bed mobility comments: Supervision for safety, pt close to mod I  Transfers Overall transfer level: Needs assistance Equipment used: Rolling walker (2 wheeled) Transfers: Sit to/from Stand Sit to Stand: Supervision General transfer comment: Supervision for safety, pt close to mod I    Balance Overall balance assessment: Needs assistance Sitting-balance support: No upper extremity supported;Feet supported Sitting balance-Leahy Scale: Good     Standing balance support: Bilateral upper extremity supported;During functional activity Standing balance-Leahy Scale: Fair    ADL Overall ADL's : Needs assistance/impaired General ADL Comments: Pt requires  assistance with LB ADLs. Pt supervision for bed mobility and supervision for functional mobility using RW. Pt able to complete straight leg raise greather than 10*, therefore did not donn KI. Pt ambulated <> BR using BSC, encouraged pt to call staff and complete ambulate whenever needing to use bathroom.     Pertinent Vitals/Pain Pain Assessment: No/denies pain (pt anticipating pain and asked for pain meds. Pt stated nerve block was still in effect.) Faces Pain Scale: No hurt Pain Intervention(s): Patient requesting pain meds-RN notified;RN gave pain meds during session   Extremity/Trunk Assessment Upper Extremity Assessment Upper Extremity Assessment: Overall WFL for tasks assessed   Lower Extremity Assessment Lower Extremity Assessment: Defer to PT evaluation   Cervical / Trunk Assessment Cervical / Trunk Assessment: Normal   Communication Communication Communication: No difficulties   Cognition Arousal/Alertness: Awake/alert Behavior During Therapy: WFL for tasks assessed/performed Overall Cognitive Status: Within Functional Limits for tasks assessed              Home Living Family/patient expects to be discharged to:: Private residence Living Arrangements: Alone Available Help at Discharge: Family;Available 24 hours/day (son plans to stay with pt for 2 weeks) Type of Home: House Home Access: Stairs to enter [employer] of Steps: 4 to enter, 16 inside Entrance Stairs-Rails: Right Home Layout: Multi-level Alternate Level Stairs-Number of Steps: 16 Alternate Level Stairs-Rails: Right Bathroom Shower/Tub: Tub/shower unit;Door   [employer] Toilet: Standard     Home Equipment: Environmental consultant - 2 wheels;Cane - single point;Tub bench;Bedside commode   Prior Functioning/Environment Level of Independence: Independent with assistive device(s)    Comments: using cane prior to surgery    OT Diagnosis: Generalized weakness;Acute pain   OT Problem List: Decreased  strength;Decreased activity tolerance;Impaired balance (sitting and/or standing);Decreased safety awareness;Pain;Decreased knowledge of use of DME or  AE   OT Treatment/Interventions: Self-care/ADL training;Therapeutic exercise;Energy conservation;DME and/or AE instruction;Therapeutic activities;Patient/family education;Balance training    OT Goals(Current goals can be found in the care plan section) Acute Rehab OT Goals Patient Stated Goal: Get home OT Goal Formulation: With patient Time For Goal Achievement: 08/17/15 Potential to Achieve Goals: Good ADL Goals Pt Will Perform Grooming: with modified independence;standing Pt Will Perform Lower Body Bathing: with modified independence;sit to/from stand;with adaptive equipment Pt Will Perform Lower Body Dressing: with modified independence;sit to/from stand;with adaptive equipment Pt Will Transfer to Toilet: with modified independence;ambulating;bedside commode Pt Will Perform Tub/Shower Transfer: Tub transfer;tub bench;rolling walker;ambulating;with modified independence  OT Frequency: Min 2X/week   Barriers to D/C: None known at this time   End of Session Equipment Utilized During Treatment: Rolling walker CPM Right Knee CPM Right Knee: Off Nurse Communication: Mobility status;Patient requests pain meds  Activity Tolerance: Patient tolerated treatment well Patient left: in chair;with call bell/phone within reach   Time: 0831-0857 OT Time Calculation (min): 26 min Charges:  OT General Charges $OT Visit: 1 Procedure OT Evaluation $Initial OT Evaluation Tier I: 1 Procedure OT Treatments $Self Care/Home Management : 8-22 mins  Breanna Mcdaniel , MS, OTR/L, CLT Pager: [phone]  08/03/2015, 9:05 AM

## 2015-08-03 NOTE — Progress Notes (Signed)
Orthopedic Tech Progress Note Patient Details:  Natalie Riddle Oct 08, 1946 [id number] On cpm at 7:30 pm Patient ID: Natalie Riddle, female   DOB: [birth year], 69 y.o.   MRN: [id number]   Braulio Bosch 08/03/2015, 7:35 PM

## 2015-08-03 NOTE — Progress Notes (Signed)
Physical Therapy Treatment Patient Details Name: Natalie Riddle MRN: [id number] DOB: September 08, 1946 Today's Date: 08/03/2015    History of Present Illness Rt TKA    PT Comments    Patient is progressing extremely well. Plan is to DC home tomorrow to give more time to practice steps as she had 16 to get from her main level to her bedroom on a daily basis. Will plan to complete steps again this afternoon.   Follow Up Recommendations  Home health PT     Equipment Recommendations       Recommendations for Other Services       Precautions / Restrictions Precautions Precautions: Knee;Fall Required Braces or Orthoses: Knee Immobilizer - Right Knee Immobilizer - Right: Other (comment) (not specified) Restrictions Weight Bearing Restrictions: Yes RLE Weight Bearing: Weight bearing as tolerated    Mobility  Bed Mobility Overal bed mobility: Needs Assistance Bed Mobility: Supine to Sit     Supine to sit: Supervision;HOB elevated     General bed mobility comments: Patient up in recliner before and after session  Transfers Overall transfer level: Needs assistance Equipment used: Rolling walker (2 wheeled) Transfers: Sit to/from Stand Sit to Stand: Supervision         General transfer comment: Supervision for safety, pt close to mod I  Ambulation/Gait Ambulation/Gait assistance: Supervision Ambulation Distance (Feet): 300 Feet Assistive device: Rolling walker (2 wheeled) Gait Pattern/deviations: Step-through pattern;Decreased stride length Gait velocity: decreased Gait velocity interpretation: Below normal speed for age/gender General Gait Details: Cues for gait sequence and positioning of RW   Stairs Stairs: Yes Stairs assistance: Min guard Stair Management: Step to pattern;One rail Right;Sideways Number of Stairs: 12 General stair comments: Cues for sequency and technique.   Wheelchair Mobility    Modified Rankin (Stroke Patients Only)        Balance Overall balance assessment: Needs assistance Sitting-balance support: No upper extremity supported;Feet supported Sitting balance-Leahy Scale: Good     Standing balance support: Bilateral upper extremity supported;During functional activity Standing balance-Leahy Scale: Fair                      Cognition Arousal/Alertness: Awake/alert Behavior During Therapy: WFL for tasks assessed/performed Overall Cognitive Status: Within Functional Limits for tasks assessed                      Exercises Total Joint Exercises Quad Sets: AROM;Right;10 reps Heel Slides: AAROM;Right;10 reps Hip ABduction/ADduction: AAROM;Right;10 reps Straight Leg Raises: AROM;Right;10 reps Long Arc Quad: AROM;Right;10 reps    General Comments        Pertinent Vitals/Pain Pain Assessment: No/denies pain Faces Pain Scale: No hurt Pain Location: no complaints. Nerve block in affect per PA and meds given prior to therapy Pain Intervention(s): Patient requesting pain meds-RN notified;RN gave pain meds during session    [place] expects to be discharged to:: Private residence Living Arrangements: Alone Available Help at Discharge: Family;Available 24 hours/day (son plans to stay with pt for 2 weeks) Type of Home: House Home Access: Stairs to enter Entrance Stairs-Rails: Right Home Layout: Multi-level Home Equipment: Walker - 2 wheels;Cane - single point;Tub bench;Bedside commode      Prior Function Level of Independence: Independent with assistive device(s)      Comments: using cane prior to surgery   PT Goals (current goals can now be found in the care plan section) Acute Rehab PT Goals Patient Stated Goal: Get home Progress towards PT goals: Progressing toward goals  Frequency  7X/week    PT Plan Current plan remains appropriate    Co-evaluation             End of Session   Activity Tolerance: Patient tolerated treatment well Patient  left: in chair;with call bell/phone within reach     Time: 0925-0951 PT Time Calculation (min) (ACUTE ONLY): 26 min  Charges:  $Gait Training: 8-22 mins $Therapeutic Exercise: 8-22 mins                    G Codes:      Jannely, [place] 08/03/2015, 10:00 AM  08/03/2015 Jacqualyn Posey PTA [phone] pager [phone] office

## 2015-08-04 LAB — BASIC METABOLIC PANEL
ANION GAP: 10 (ref 5–15)
BUN: 21 mg/dL — AB (ref 6–20)
CO2: 21 mmol/L — ABNORMAL LOW (ref 22–32)
Calcium: 8.7 mg/dL — ABNORMAL LOW (ref 8.9–10.3)
Chloride: 108 mmol/L (ref 101–111)
Creatinine, Ser: 0.9 mg/dL (ref 0.44–1.00)
GFR calc Af Amer: >60 mL/min (ref >60)
Glucose, Bld: 215 mg/dL — ABNORMAL HIGH (ref 65–99)
Potassium: 3.6 mmol/L (ref 3.5–5.1)
Sodium: 139 mmol/L (ref 135–145)

## 2015-08-04 LAB — GLUCOSE, CAPILLARY
GLUCOSE-CAPILLARY: 217 mg/dL — AB (ref 65–99)
GLUCOSE-CAPILLARY: 184 mg/dL — AB (ref 65–99)

## 2015-08-04 LAB — CBC
HCT: 31.1 % — ABNORMAL LOW (ref 36.0–46.0)
Hemoglobin: 10.6 g/dL — ABNORMAL LOW (ref 12.0–15.0)
MCH: 31.7 pg (ref 26.0–34.0)
MCHC: 34.1 g/dL (ref 30.0–36.0)
MCV: 93.1 fL (ref 78.0–100.0)
PLATELETS: 177 10*3/uL (ref 150–400)
RBC: 3.34 MIL/uL — AB (ref 3.87–5.11)
RDW: 13.3 % (ref 11.5–15.5)
WBC: 13 10*3/uL — AB (ref 4.0–10.5)

## 2015-08-04 MED ORDER — POLYETHYLENE GLYCOL 3350 17 G PO PACK: PACK | ORAL | Status: DC

## 2015-08-04 MED ORDER — APIXABAN 2.5 MG PO TABS: 2.5000 mg | ORAL_TABLET | Freq: Two times a day (BID) | ORAL | Status: DC

## 2015-08-04 MED ORDER — DOCUSATE SODIUM 100 MG PO CAPS: ORAL_CAPSULE | ORAL | Status: DC

## 2015-08-04 MED ORDER — OXYCODONE HCL 5 MG PO TABS: ORAL_TABLET | ORAL | Status: DC

## 2015-08-04 NOTE — Progress Notes (Signed)
Occupational Therapy Treatment Patient Details Name: Natalie Riddle MRN: [id number] DOB: 12-04-1946 Today's Date: 08/04/2015    History of present illness Rt TKA   OT comments  Completed all education regarding compensatory techniques for ADL and home safety/reducing risk of falls. Pt ready to D/C home when medically stable. No further OT needs.   Follow Up Recommendations  No OT follow up    Equipment Recommendations       Recommendations for Other Services      Precautions / Restrictions Precautions Precautions: Knee Restrictions RLE Weight Bearing: Weight bearing as tolerated       Mobility Bed Mobility Overal bed mobility: Independent                Transfers Overall transfer level: Modified independent                    Balance                                   ADL Overall ADL's : Needs assistance/impaired                                       General ADL Comments: Completed education regarding compensatory techniques for ADL and available AE to increase independence. Pt also states that she has difficulty reaching her feet to tie shoes due to her "belly". given elastic laces. discussed home safety and reducing risks of falls. discussed home set up for tub transfer and pt plans to use tub bench and remove bathroom doors. Pt's son will be with her for 2 weeks. educated on home set up to maximize her independence.       Vision                     Perception     Praxis      Cognition   Behavior During Therapy: WFL for tasks assessed/performed Overall Cognitive Status: Within Functional Limits for tasks assessed                       Extremity/Trunk Assessment               Exercises     Shoulder Instructions       General Comments      Pertinent Vitals/ Pain       Pain Assessment: 0-10 Pain Score: 3  Pain Location: R knee Pain Intervention(s): Monitored during  session  Home Living                                          Prior Functioning/Environment              Frequency       Progress Toward Goals  OT Goals(current goals can now be found in the care plan section)     Acute Rehab OT Goals Patient Stated Goal: Get home OT Goal Formulation: With patient Time For Goal Achievement: 08/17/15 Potential to Achieve Goals: Good ADL Goals Pt Will Perform Grooming: with modified independence;standing Pt Will Perform Lower Body Bathing: with modified independence;sit to/from stand;with adaptive equipment Pt Will Perform Lower Body Dressing: with modified independence;sit to/from stand;with adaptive equipment Pt Will  Transfer to Toilet: with modified independence;ambulating;bedside commode Pt Will Perform Tub/Shower Transfer: Tub transfer;tub bench;rolling walker;ambulating;with modified independence  Plan Discharge plan remains appropriate    Co-evaluation                 End of Session CPM Right Knee CPM Right Knee: On   Activity Tolerance Patient tolerated treatment well   Patient Left in bed;Other (comment) (with PA)   Nurse Communication Mobility status        Time: 3016-0109 OT Time Calculation (min): 18 min  Charges: OT General Charges $OT Visit: 1 Procedure OT Treatments $Self Care/Home Management : 8-22 mins  Nissan Frazzini,Natalie Riddle 08/04/2015, 12:26 PM   [facility], OTR/L  [phone] 08/04/2015

## 2015-08-04 NOTE — Care Management Important Message (Signed)
Important Message  Patient Details  Name: Natalie Riddle MRN: [id number] Date of Birth: 1946/03/15   Medicare Important Message Given:  Yes-second notification given    Delorse Lek 08/04/2015, 11:23 AM

## 2015-08-04 NOTE — Discharge Summary (Signed)
Patient ID: Natalie Riddle MRN: [id number] DOB/AGE: 1946-04-05 69 y.o.  Admit date: 08/02/2015 Discharge date: 08/04/2015  Admission Diagnoses:  Principal Problem:   Primary localized osteoarthritis of right knee Active Problems:   Diabetes mellitus without complication   Hypertension   Hyperlipidemia   Obesity   Sleep apnea   DJD (degenerative joint disease) of knee   Discharge Diagnoses:  Same  Past Medical History  Diagnosis Date  . Hyperlipidemia     takes Atorvastatin daily  . Cancer   . Primary localized osteoarthritis of right knee   . Diabetes mellitus without complication     takes Metformin daily  . Hypertension     takes Benicar daily  . Glaucoma     uses eye drops daily and takes Diamox daily  . Joint pain   . Joint swelling   . History of gastric ulcer   . History of colon polyps     benign  . Diverticulosis   . Urinary urgency   . Sleep apnea     uses CPAP  . Macular degeneration     dry    Surgeries: Procedure(s): TOTAL KNEE ARTHROPLASTY on 08/02/2015   Consultants:    Discharged Condition: Improved  Hospital Course: Natalie Riddle is an 69 y.o. female who was admitted 08/02/2015 for operative treatment ofPrimary localized osteoarthritis of right knee. Patient has severe unremitting pain that affects sleep, daily activities, and work/hobbies. After pre-op clearance the patient was taken to the operating room on 08/02/2015 and underwent  Procedure(s): TOTAL KNEE ARTHROPLASTY.    Patient was given perioperative antibiotics: Anti-infectives    Start     Dose/Rate Route Frequency Ordered Stop   08/02/15 1330  ceFAZolin (ANCEF) IVPB 2 g/50 mL premix     2 g 100 mL/hr over 30 Minutes Intravenous Every 6 hours 08/02/15 1040 08/02/15 2200   08/02/15 0542  ceFAZolin (ANCEF) IVPB 2 g/50 mL premix     2 g 100 mL/hr over 30 Minutes Intravenous On call to O.R. 08/02/15 0542 08/02/15 0732       Patient was given sequential compression  devices, early ambulation, and chemoprophylaxis to prevent DVT.  Patient benefited maximally from hospital stay and there were no complications.    Recent vital signs: Patient Vitals for the past 24 hrs:  BP Temp Temp src Pulse Resp SpO2  08/04/15 0541 (!) 115/59 mmHg 97.8 F (36.6 C) Oral 90 16 97 %  08/03/15 2211 (!) 114/47 mmHg 99 F (37.2 C) Oral 82 16 97 %  08/03/15 1353 134/61 mmHg 98.1 F (36.7 C) - 89 16 96 %     Recent laboratory studies:  Recent Labs  08/03/15 0823 08/04/15 0502  WBC 12.0* 13.0*  HGB 10.9* 10.6*  HCT 32.6* 31.1*  PLT 188 177  NA 136 139  K 3.7 3.6  CL 107 108  CO2 20* 21*  BUN 15 21*  CREATININE 0.87 0.90  GLUCOSE 240* 215*  CALCIUM 8.6* 8.7*     Discharge Medications:     Medication List    STOP taking these medications        ACTIVELLA 0.5-0.1 MG per tablet  Generic drug:  Estradiol-Norethindrone Acet     aspirin 81 MG tablet     Co Q 10 100 MG Caps     HYALURONIC ACID PO     LUTEIN PO     multivitamin with minerals tablet     OSTEO BI-FLEX JOINT SHIELD PO  OVER THE COUNTER MEDICATION     PRESERVISION AREDS PO     TURMERIC PO      TAKE these medications        acetaZOLAMIDE 500 MG capsule  Commonly known as:  DIAMOX  Take 500 mg by mouth daily.     apixaban 2.5 MG Tabs tablet  Commonly known as:  ELIQUIS  Take 1 tablet (2.5 mg total) by mouth every 12 (twelve) hours.     atorvastatin 20 MG tablet  Commonly known as:  LIPITOR  Take 20 mg by mouth daily.     celecoxib 200 MG capsule  Commonly known as:  CELEBREX  Take 200 mg by mouth daily.     cycloSPORINE 0.05 % ophthalmic emulsion  Commonly known as:  RESTASIS  Place 1 drop into both eyes 2 (two) times daily.     docusate sodium 100 MG capsule  Commonly known as:  COLACE  1 tab 2 times a day while on narcotics.  STOOL SOFTENER     GENTEAL PM OP  Apply 1 drop to eye at bedtime. Preservative Free     LIQUID TEARS OP  Apply 1 drop to eye daily as  needed.     metFORMIN 500 MG tablet  Commonly known as:  GLUCOPHAGE  Take 500 mg by mouth 2 (two) times daily with a meal.     olmesartan-hydrochlorothiazide 40-25 MG per tablet  Commonly known as:  BENICAR HCT  Take 1 tablet by mouth daily.     oxyCODONE 5 MG immediate release tablet  Commonly known as:  Oxy IR/ROXICODONE  1-2 tablets every 4-6 hrs as needed for pain     polyethylene glycol packet  Commonly known as:  MIRALAX / GLYCOLAX  17grams in 16 oz of water twice a day until bowel movement.  LAXITIVE.  Restart if two days since last bowel movement     Vitamin D 2000 UNITS tablet  Take 2,000 Units by mouth daily.        Diagnostic Studies: No results found.  Disposition: Final discharge disposition not confirmed      Discharge Instructions    CPM    Complete by:  As directed   Continuous passive motion machine (CPM):      Use the CPM from 0 to 90 for 6 hours per day.       You may break it up into 2 or 3 sessions per day.      Use CPM for 2 weeks or until you are told to stop.     Call MD / Call 911    Complete by:  As directed   If you experience chest pain or shortness of breath, CALL 911 and be transported to the hospital emergency room.  If you develope a fever above 101 F, pus (white drainage) or increased drainage or redness at the wound, or calf pain, call your surgeon's office.     Change dressing    Complete by:  As directed   Change the gauze dressing daily with sterile 4 x 4 inch gauze and apply TED hose.  DO NOT REMOVE BANDAGE OVER SURGICAL INCISION.  [place] WHOLE LEG INCLUDING OVER THE WATERPROOF BANDAGE WITH SOAP AND WATER EVERY DAY.     Constipation Prevention    Complete by:  As directed   Drink plenty of fluids.  Prune juice may be helpful.  You may use a stool softener, such as Colace (over the counter) 100 mg twice a day.  Use MiraLax (over the counter) for constipation as needed.     Diet - low sodium heart healthy    Complete by:  As directed       Discharge instructions    Complete by:  As directed   INSTRUCTIONS AFTER JOINT REPLACEMENT   Remove items at home which could result in a fall. This includes throw rugs or furniture in walking pathways ICE to the affected joint every three hours while awake for 30 minutes at a time, for at least the first 3-5 days, and then as needed for pain and swelling.  Continue to use ice for pain and swelling. You may notice swelling that will progress down to the foot and ankle.  This is normal after surgery.  Elevate your leg when you are not up walking on it.   Continue to use the breathing machine you got in the hospital (incentive spirometer) which will help keep your temperature down.  It is common for your temperature to cycle up and down following surgery, especially at night when you are not up moving around and exerting yourself.  The breathing machine keeps your lungs expanded and your temperature down.   DIET:  As you were doing prior to hospitalization, we recommend a well-balanced diet.  DRESSING / WOUND CARE / SHOWERING  Keep the surgical dressing until follow up.  The dressing is water proof, so you can shower without any extra covering.  IF THE DRESSING FALLS OFF or the wound gets wet inside, change the dressing with sterile gauze.  Please use good hand washing techniques before changing the dressing.  Do not use any lotions or creams on the incision until instructed by your surgeon.    ACTIVITY  Increase activity slowly as tolerated, but follow the weight bearing instructions below.   No driving for 6 weeks or until further direction given by your physician.  You cannot drive while taking narcotics.  No lifting or carrying greater than 10 lbs. until further directed by your surgeon. Avoid periods of inactivity such as sitting longer than an hour when not asleep. This helps prevent blood clots.  You may return to work once you are authorized by your doctor.     WEIGHT BEARING    Weight bearing as tolerated with assist device (walker, cane, etc) as directed, use it as long as suggested by your surgeon or therapist, typically at least 1-2 weeks.   EXERCISES  Results after joint replacement surgery are often greatly improved when you follow the exercise, range of motion and muscle strengthening exercises prescribed by your doctor. Safety measures are also important to protect the joint from further injury. Any time any of these exercises cause you to have increased pain or swelling, decrease what you are doing until you are comfortable again and then slowly increase them. If you have problems or questions, call your caregiver or physical therapist for advice.   Rehabilitation is important following a joint replacement. After just a few days of immobilization, the muscles of the leg can become weakened and shrink (atrophy).  These exercises are designed to build up the tone and strength of the thigh and leg muscles and to improve motion. Often times heat used for twenty to thirty minutes before working out will loosen up your tissues and help with improving the range of motion but do not use heat for the first two weeks following surgery (sometimes heat can increase post-operative swelling).   These exercises can be done on a  training (exercise) mat, on the floor, on a table or on a bed. Use whatever works the best and is most comfortable for you.    Use music or television while you are exercising so that the exercises are a pleasant break in your day. This will make your life better with the exercises acting as a break in your routine that you can look forward to.   Perform all exercises about fifteen times, three times per day or as directed.  You should exercise both the operative leg and the other leg as well.   Exercises include:   Quad Sets - Tighten up the muscle on the front of the thigh (Quad) and hold for 5-10 seconds.   Straight Leg Raises - With your knee straight  (if you were given a brace, keep it on), lift the leg to 60 degrees, hold for 3 seconds, and slowly lower the leg.  Perform this exercise against resistance later as your leg gets stronger.  Leg Slides: Lying on your back, slowly slide your foot toward your buttocks, bending your knee up off the floor (only go as far as is comfortable). Then slowly slide your foot back down until your leg is flat on the floor again.  Angel Wings: Lying on your back spread your legs to the side as far apart as you can without causing discomfort.  Hamstring Strength:  Lying on your back, push your heel against the floor with your leg straight by tightening up the muscles of your buttocks.  Repeat, but this time bend your knee to a comfortable angle, and push your heel against the floor.  You may put a pillow under the heel to make it more comfortable if necessary.   A rehabilitation program following joint replacement surgery can speed recovery and prevent re-injury in the future due to weakened muscles. Contact your doctor or a physical therapist for more information on knee rehabilitation.    CONSTIPATION  Constipation is defined medically as fewer than three stools per week and severe constipation as less than one stool per week.  Even if you have a regular bowel pattern at home, your normal regimen is likely to be disrupted due to multiple reasons following surgery.  Combination of anesthesia, postoperative narcotics, change in appetite and fluid intake all can affect your bowels.   YOU MUST use at least one of the following options; they are listed in order of increasing strength to get the job done.  They are all available over the counter, and you may need to use some, POSSIBLY even all of these options:    Drink plenty of fluids (prune juice may be helpful) and high fiber foods Colace 100 mg by mouth twice a day  Senokot for constipation as directed and as needed Dulcolax (bisacodyl), take with full glass of  water  Miralax (polyethylene glycol) once or twice a day as needed.  If you have tried all these things and are unable to have a bowel movement in the first 3-4 days after surgery call either your surgeon or your primary doctor.    If you experience loose stools or diarrhea, hold the medications until you stool forms back up.  If your symptoms do not get better within 1 week or if they get worse, check with your doctor.  If you experience "the worst abdominal pain ever" or develop nausea or vomiting, please contact the office immediately for further recommendations for treatment.   ITCHING:  If you experience itching  with your medications, try taking only a single pain pill, or even half a pain pill at a time.  You can also use Benadryl over the counter for itching or also to help with sleep.   TED HOSE STOCKINGS:  Use stockings on both legs until for at least 2 weeks or as directed by physician office. They may be removed at night for sleeping.  MEDICATIONS:  See your medication summary on the "After Visit Summary" that nursing will review with you.  You may have some home medications which will be placed on hold until you complete the course of blood thinner medication.  It is important for you to complete the blood thinner medication as prescribed.  PRECAUTIONS:  If you experience chest pain or shortness of breath - call 911 immediately for transfer to the hospital emergency department.   If you develop a fever greater that 101 F, purulent drainage from wound, increased redness or drainage from wound, foul odor from the wound/dressing, or calf pain - CONTACT YOUR SURGEON.                                                   FOLLOW-UP APPOINTMENTS:  If you do not already have a post-op appointment, please call the office for an appointment to be seen by your surgeon.  Guidelines for how soon to be seen are listed in your "After Visit Summary", but are typically between 1-4 weeks after  surgery.  OTHER INSTRUCTIONS:   Knee Replacement:  Do not place pillow under knee, focus on keeping the knee straight while resting. CPM instructions: 0-90 degrees, 2 hours in the morning, 2 hours in the afternoon, and 2 hours in the evening. Place foam block, curve side up under heel at all times except when in CPM or when walking.  DO NOT modify, tear, cut, or change the foam block in any way.  MAKE SURE YOU:  Understand these instructions.  Get help right away if you are not doing well or get worse.    Thank you for letting us be a part of your medical care team.  It is a privilege we respect greatly.  We hope these instructions will help you stay on track for a fast and full recovery!     Do not put a pillow under the knee. Place it under the heel.    Complete by:  As directed   Place gray foam block, curve side up under heel at all times except when in CPM or when walking.  DO NOT modify, tear, cut, or change in any way the gray foam block.     Increase activity slowly as tolerated    Complete by:  As directed      TED hose    Complete by:  As directed   Use stockings (TED hose) for 2 weeks on both leg(s).  You may remove them at night for sleeping.           Follow-up Information    Follow up with [facility].   Why:  They will contact you to schedule home therapy visits.   Contact information:   [address] [place] [address] [phone]       Follow up with Lorn Junes, MD On 08/16/2015.   Specialty:  Orthopedic Surgery   Why:  appt time 2:30 pm   Contact information:   [place] [address] [phone]        Signed: Linda Hedges 08/04/2015, 12:48 PM

## 2015-08-04 NOTE — Progress Notes (Signed)
Physical Therapy Treatment Patient Details Name: Natalie Riddle MRN: [id number] DOB: [birth year] Today's Date: 08/04/2015    History of Present Illness Rt TKA    PT Comments    Patient is making good progress with PT.  From a mobility standpoint anticipate patient will be ready for DC home. Patient denies any questions or concerns.      Follow Up Recommendations  Home health PT     Equipment Recommendations  None recommended by PT    Recommendations for Other Services       Precautions / Restrictions Precautions Precautions: Knee Precaution Booklet Issued: Yes (comment) Precaution Comments: HEP Restrictions Weight Bearing Restrictions: Yes RLE Weight Bearing: Weight bearing as tolerated    Mobility  Bed Mobility Overal bed mobility: Independent Bed Mobility: Supine to Sit              Transfers Overall transfer level: Modified independent Equipment used: Rolling walker (2 wheeled) Transfers: Sit to/from Stand Sit to Stand: Modified independent (Device/Increase time)            Ambulation/Gait Ambulation/Gait assistance: Supervision Ambulation Distance (Feet): 300 Feet Assistive device: Rolling walker (2 wheeled) Gait Pattern/deviations: Step-through pattern Gait velocity: decreased       Stairs Stairs: Yes Stairs assistance: Supervision Stair Management: One rail Right;Sideways Number of Stairs: 4    Wheelchair Mobility    Modified Rankin (Stroke Patients Only)       Balance Overall balance assessment: Needs assistance Sitting-balance support: No upper extremity supported Sitting balance-Leahy Scale: Normal     Standing balance support: No upper extremity supported Standing balance-Leahy Scale: Fair                      Cognition Arousal/Alertness: Awake/alert Behavior During Therapy: WFL for tasks assessed/performed Overall Cognitive Status: Within Functional Limits for tasks assessed                      Exercises Total Joint Exercises Ankle Circles/Pumps: AROM;Both;20 reps Quad Sets: Right;10 reps;Strengthening Heel Slides: AAROM;Right;10 reps Hip ABduction/ADduction: AAROM;Right;10 reps Straight Leg Raises: Right;10 reps;Strengthening Long Arc Quad: AROM;Right;10 reps Goniometric ROM: 102 flexion    General Comments        Pertinent Vitals/Pain Pain Assessment: No/denies pain Pain Location: Rt knee Pain Intervention(s): Monitored during session    Home Living                      Prior Function            PT Goals (current goals can now be found in the care plan section) Acute Rehab PT Goals Patient Stated Goal: Get home PT Goal Formulation: With patient Time For Goal Achievement: 08/16/15 Potential to Achieve Goals: Good Progress towards PT goals: Progressing toward goals    Frequency  7X/week    PT Plan Current plan remains appropriate    Co-evaluation             End of Session Equipment Utilized During Treatment: Gait belt Activity Tolerance: Patient tolerated treatment well Patient left: in chair;with call bell/phone within reach;Other (comment)     Time: 8527-7824 PT Time Calculation (min) (ACUTE ONLY): 34 min  Charges:  $Gait Training: 8-22 mins $Therapeutic Exercise: 8-22 mins                    G Codes:      Cassell Clement, PT, CSCS Pager [phone] Office [phone]  [phone]  08/04/2015, 8:55 AM

## 2015-08-04 NOTE — Plan of Care (Signed)
Problem: Consults Goal: Diagnosis- Total Joint Replacement Outcome: Completed/Met Date Met:  08/04/15 Primary Total Knee Right

## 2015-08-05 DIAGNOSIS — I10 Essential (primary) hypertension: Secondary | ICD-10-CM | POA: Diagnosis not present

## 2015-08-05 DIAGNOSIS — E119 Type 2 diabetes mellitus without complications: Secondary | ICD-10-CM | POA: Diagnosis not present

## 2015-08-05 DIAGNOSIS — Z96651 Presence of right artificial knee joint: Secondary | ICD-10-CM | POA: Diagnosis not present

## 2015-08-05 DIAGNOSIS — G4733 Obstructive sleep apnea (adult) (pediatric): Secondary | ICD-10-CM | POA: Diagnosis not present

## 2015-08-05 DIAGNOSIS — Z471 Aftercare following joint replacement surgery: Secondary | ICD-10-CM | POA: Diagnosis not present

## 2015-08-05 DIAGNOSIS — M1712 Unilateral primary osteoarthritis, left knee: Secondary | ICD-10-CM | POA: Diagnosis not present

## 2015-08-06 DIAGNOSIS — Z471 Aftercare following joint replacement surgery: Secondary | ICD-10-CM | POA: Diagnosis not present

## 2015-08-06 DIAGNOSIS — E119 Type 2 diabetes mellitus without complications: Secondary | ICD-10-CM | POA: Diagnosis not present

## 2015-08-06 DIAGNOSIS — M1712 Unilateral primary osteoarthritis, left knee: Secondary | ICD-10-CM | POA: Diagnosis not present

## 2015-08-06 DIAGNOSIS — I10 Essential (primary) hypertension: Secondary | ICD-10-CM | POA: Diagnosis not present

## 2015-08-06 DIAGNOSIS — Z96651 Presence of right artificial knee joint: Secondary | ICD-10-CM | POA: Diagnosis not present

## 2015-08-06 DIAGNOSIS — G4733 Obstructive sleep apnea (adult) (pediatric): Secondary | ICD-10-CM | POA: Diagnosis not present

## 2015-08-09 DIAGNOSIS — E119 Type 2 diabetes mellitus without complications: Secondary | ICD-10-CM | POA: Diagnosis not present

## 2015-08-09 DIAGNOSIS — I10 Essential (primary) hypertension: Secondary | ICD-10-CM | POA: Diagnosis not present

## 2015-08-09 DIAGNOSIS — Z471 Aftercare following joint replacement surgery: Secondary | ICD-10-CM | POA: Diagnosis not present

## 2015-08-09 DIAGNOSIS — M1712 Unilateral primary osteoarthritis, left knee: Secondary | ICD-10-CM | POA: Diagnosis not present

## 2015-08-09 DIAGNOSIS — Z96651 Presence of right artificial knee joint: Secondary | ICD-10-CM | POA: Diagnosis not present

## 2015-08-09 DIAGNOSIS — G4733 Obstructive sleep apnea (adult) (pediatric): Secondary | ICD-10-CM | POA: Diagnosis not present

## 2015-08-11 DIAGNOSIS — Z96651 Presence of right artificial knee joint: Secondary | ICD-10-CM | POA: Diagnosis not present

## 2015-08-11 DIAGNOSIS — M1712 Unilateral primary osteoarthritis, left knee: Secondary | ICD-10-CM | POA: Diagnosis not present

## 2015-08-11 DIAGNOSIS — G4733 Obstructive sleep apnea (adult) (pediatric): Secondary | ICD-10-CM | POA: Diagnosis not present

## 2015-08-11 DIAGNOSIS — Z471 Aftercare following joint replacement surgery: Secondary | ICD-10-CM | POA: Diagnosis not present

## 2015-08-11 DIAGNOSIS — I10 Essential (primary) hypertension: Secondary | ICD-10-CM | POA: Diagnosis not present

## 2015-08-11 DIAGNOSIS — E119 Type 2 diabetes mellitus without complications: Secondary | ICD-10-CM | POA: Diagnosis not present

## 2015-08-13 DIAGNOSIS — G4733 Obstructive sleep apnea (adult) (pediatric): Secondary | ICD-10-CM | POA: Diagnosis not present

## 2015-08-13 DIAGNOSIS — M1712 Unilateral primary osteoarthritis, left knee: Secondary | ICD-10-CM | POA: Diagnosis not present

## 2015-08-13 DIAGNOSIS — E119 Type 2 diabetes mellitus without complications: Secondary | ICD-10-CM | POA: Diagnosis not present

## 2015-08-13 DIAGNOSIS — I10 Essential (primary) hypertension: Secondary | ICD-10-CM | POA: Diagnosis not present

## 2015-08-13 DIAGNOSIS — Z96651 Presence of right artificial knee joint: Secondary | ICD-10-CM | POA: Diagnosis not present

## 2015-08-13 DIAGNOSIS — Z471 Aftercare following joint replacement surgery: Secondary | ICD-10-CM | POA: Diagnosis not present

## 2015-08-16 DIAGNOSIS — Z96651 Presence of right artificial knee joint: Secondary | ICD-10-CM | POA: Diagnosis not present

## 2015-08-18 DIAGNOSIS — M25361 Other instability, right knee: Secondary | ICD-10-CM | POA: Diagnosis not present

## 2015-08-18 DIAGNOSIS — R531 Weakness: Secondary | ICD-10-CM | POA: Diagnosis not present

## 2015-08-18 DIAGNOSIS — M25561 Pain in right knee: Secondary | ICD-10-CM | POA: Diagnosis not present

## 2015-08-20 DIAGNOSIS — M25561 Pain in right knee: Secondary | ICD-10-CM | POA: Diagnosis not present

## 2015-08-20 DIAGNOSIS — R531 Weakness: Secondary | ICD-10-CM | POA: Diagnosis not present

## 2015-08-20 DIAGNOSIS — M25361 Other instability, right knee: Secondary | ICD-10-CM | POA: Diagnosis not present

## 2015-08-23 DIAGNOSIS — R531 Weakness: Secondary | ICD-10-CM | POA: Diagnosis not present

## 2015-08-23 DIAGNOSIS — M25561 Pain in right knee: Secondary | ICD-10-CM | POA: Diagnosis not present

## 2015-08-23 DIAGNOSIS — M25361 Other instability, right knee: Secondary | ICD-10-CM | POA: Diagnosis not present

## 2015-08-25 DIAGNOSIS — R531 Weakness: Secondary | ICD-10-CM | POA: Diagnosis not present

## 2015-08-25 DIAGNOSIS — M25561 Pain in right knee: Secondary | ICD-10-CM | POA: Diagnosis not present

## 2015-08-25 DIAGNOSIS — M25361 Other instability, right knee: Secondary | ICD-10-CM | POA: Diagnosis not present

## 2015-08-26 DIAGNOSIS — M25561 Pain in right knee: Secondary | ICD-10-CM | POA: Diagnosis not present

## 2015-08-26 DIAGNOSIS — M25361 Other instability, right knee: Secondary | ICD-10-CM | POA: Diagnosis not present

## 2015-08-26 DIAGNOSIS — R531 Weakness: Secondary | ICD-10-CM | POA: Diagnosis not present

## 2015-08-27 DIAGNOSIS — Z0000 Encounter for general adult medical examination without abnormal findings: Secondary | ICD-10-CM | POA: Diagnosis not present

## 2015-08-27 DIAGNOSIS — Z6838 Body mass index (BMI) 38.0-38.9, adult: Secondary | ICD-10-CM | POA: Diagnosis not present

## 2015-08-27 DIAGNOSIS — I10 Essential (primary) hypertension: Secondary | ICD-10-CM | POA: Diagnosis not present

## 2015-08-27 DIAGNOSIS — E669 Obesity, unspecified: Secondary | ICD-10-CM | POA: Diagnosis not present

## 2015-08-27 DIAGNOSIS — E780 Pure hypercholesterolemia: Secondary | ICD-10-CM | POA: Diagnosis not present

## 2015-08-27 DIAGNOSIS — Z23 Encounter for immunization: Secondary | ICD-10-CM | POA: Diagnosis not present

## 2015-08-27 DIAGNOSIS — G4733 Obstructive sleep apnea (adult) (pediatric): Secondary | ICD-10-CM | POA: Diagnosis not present

## 2015-08-27 DIAGNOSIS — E119 Type 2 diabetes mellitus without complications: Secondary | ICD-10-CM | POA: Diagnosis not present

## 2015-08-27 DIAGNOSIS — Z1389 Encounter for screening for other disorder: Secondary | ICD-10-CM | POA: Diagnosis not present

## 2015-08-31 DIAGNOSIS — M25561 Pain in right knee: Secondary | ICD-10-CM | POA: Diagnosis not present

## 2015-08-31 DIAGNOSIS — R531 Weakness: Secondary | ICD-10-CM | POA: Diagnosis not present

## 2015-08-31 DIAGNOSIS — M25361 Other instability, right knee: Secondary | ICD-10-CM | POA: Diagnosis not present

## 2015-09-01 DIAGNOSIS — M25361 Other instability, right knee: Secondary | ICD-10-CM | POA: Diagnosis not present

## 2015-09-01 DIAGNOSIS — R531 Weakness: Secondary | ICD-10-CM | POA: Diagnosis not present

## 2015-09-01 DIAGNOSIS — M25561 Pain in right knee: Secondary | ICD-10-CM | POA: Diagnosis not present

## 2015-09-02 DIAGNOSIS — R531 Weakness: Secondary | ICD-10-CM | POA: Diagnosis not present

## 2015-09-02 DIAGNOSIS — M25561 Pain in right knee: Secondary | ICD-10-CM | POA: Diagnosis not present

## 2015-09-02 DIAGNOSIS — M25361 Other instability, right knee: Secondary | ICD-10-CM | POA: Diagnosis not present

## 2015-09-07 DIAGNOSIS — M25561 Pain in right knee: Secondary | ICD-10-CM | POA: Diagnosis not present

## 2015-09-07 DIAGNOSIS — Z96651 Presence of right artificial knee joint: Secondary | ICD-10-CM | POA: Diagnosis not present

## 2015-09-07 DIAGNOSIS — M25361 Other instability, right knee: Secondary | ICD-10-CM | POA: Diagnosis not present

## 2015-09-07 DIAGNOSIS — R531 Weakness: Secondary | ICD-10-CM | POA: Diagnosis not present

## 2015-09-09 DIAGNOSIS — M25361 Other instability, right knee: Secondary | ICD-10-CM | POA: Diagnosis not present

## 2015-09-09 DIAGNOSIS — M25561 Pain in right knee: Secondary | ICD-10-CM | POA: Diagnosis not present

## 2015-09-09 DIAGNOSIS — R531 Weakness: Secondary | ICD-10-CM | POA: Diagnosis not present

## 2015-09-13 DIAGNOSIS — M25561 Pain in right knee: Secondary | ICD-10-CM | POA: Diagnosis not present

## 2015-09-13 DIAGNOSIS — R531 Weakness: Secondary | ICD-10-CM | POA: Diagnosis not present

## 2015-09-13 DIAGNOSIS — M25361 Other instability, right knee: Secondary | ICD-10-CM | POA: Diagnosis not present

## 2015-09-15 DIAGNOSIS — M25561 Pain in right knee: Secondary | ICD-10-CM | POA: Diagnosis not present

## 2015-09-15 DIAGNOSIS — R531 Weakness: Secondary | ICD-10-CM | POA: Diagnosis not present

## 2015-09-15 DIAGNOSIS — M25361 Other instability, right knee: Secondary | ICD-10-CM | POA: Diagnosis not present

## 2015-09-16 DIAGNOSIS — R531 Weakness: Secondary | ICD-10-CM | POA: Diagnosis not present

## 2015-09-16 DIAGNOSIS — M25561 Pain in right knee: Secondary | ICD-10-CM | POA: Diagnosis not present

## 2015-09-16 DIAGNOSIS — M25361 Other instability, right knee: Secondary | ICD-10-CM | POA: Diagnosis not present

## 2015-09-21 DIAGNOSIS — R531 Weakness: Secondary | ICD-10-CM | POA: Diagnosis not present

## 2015-09-21 DIAGNOSIS — M25561 Pain in right knee: Secondary | ICD-10-CM | POA: Diagnosis not present

## 2015-09-21 DIAGNOSIS — M25361 Other instability, right knee: Secondary | ICD-10-CM | POA: Diagnosis not present

## 2015-09-23 DIAGNOSIS — M25361 Other instability, right knee: Secondary | ICD-10-CM | POA: Diagnosis not present

## 2015-09-23 DIAGNOSIS — R531 Weakness: Secondary | ICD-10-CM | POA: Diagnosis not present

## 2015-09-23 DIAGNOSIS — M25561 Pain in right knee: Secondary | ICD-10-CM | POA: Diagnosis not present

## 2015-10-05 DIAGNOSIS — Z96651 Presence of right artificial knee joint: Secondary | ICD-10-CM | POA: Diagnosis not present

## 2015-11-03 DIAGNOSIS — Z96651 Presence of right artificial knee joint: Secondary | ICD-10-CM | POA: Diagnosis not present

## 2015-11-03 DIAGNOSIS — M1712 Unilateral primary osteoarthritis, left knee: Secondary | ICD-10-CM | POA: Diagnosis present

## 2015-11-03 NOTE — H&P (Signed)
TOTAL KNEE ADMISSION H&P  Patient is being admitted for left total knee arthroplasty.  Subjective:  Chief Complaint:left knee pain.  HPI: Natalie Riddle, 69 y.o. female, has a history of pain and functional disability in the left knee due to arthritis and has failed non-surgical conservative treatments for greater than 12 weeks to includeNSAID's and/or analgesics, corticosteriod injections, viscosupplementation injections, flexibility and strengthening excercises, supervised PT with diminished ADL's post treatment, use of assistive devices, weight reduction as appropriate and activity modification.  Onset of symptoms was gradual, starting 10 years ago with gradually worsening course since that time. The patient noted no past surgery on the left knee(s).  Patient currently rates pain in the left knee(s) at 10 out of 10 with activity. Patient has night pain, worsening of pain with activity and weight bearing, pain that interferes with activities of daily living, crepitus and joint swelling.  Patient has evidence of subchondral sclerosis, periarticular osteophytes and joint space narrowing by imaging studies. There is no active infection.  Patient Active Problem List   Diagnosis Date Noted  . Primary localized osteoarthritis of left knee 11/03/2015  . DJD (degenerative joint disease) of knee 08/02/2015  . Diabetes mellitus without complication ([place])   . Hypertension   . Hyperlipidemia   . Obesity   . Sleep apnea   . Primary localized osteoarthritis of right knee    Past Medical History  Diagnosis Date  . Hyperlipidemia     takes Atorvastatin daily  . Cancer   . Primary localized osteoarthritis of right knee   . Diabetes mellitus without complication     takes Metformin daily  . Hypertension     takes Benicar daily  . Glaucoma     uses eye drops daily and takes Diamox daily  . Joint pain   . Joint swelling   . History of gastric ulcer   . History of colon polyps     benign   . Diverticulosis   . Urinary urgency   . Sleep apnea     uses CPAP  . Macular degeneration     dry    Past Surgical History  Procedure Laterality Date  . Cesarean section  41 yrs ago  . Tubal ligation  15yrs ago  . [place]    . Knee arthroscopy Right   . Colonoscopy    . Esophagogastroduodenoscopy    . Cartaract surgery Left   . Total knee arthroplasty Right 08/02/2015    Procedure: TOTAL KNEE ARTHROPLASTY;  Surgeon: Elsie Saas, MD;  Location: [facility] [facility];  Service: Orthopedics;  Laterality: Right;    No current facility-administered medications for this encounter.  Current outpatient prescriptions:  .  acetaZOLAMIDE (DIAMOX) 500 MG capsule, Take 500 mg by mouth daily., Disp: , Rfl:  .  apixaban (ELIQUIS) 2.5 MG TABS tablet, Take 1 tablet (2.5 mg total) by mouth every 12 (twelve) hours., Disp: 30 tablet, Rfl: 0 .  atorvastatin (LIPITOR) 20 MG tablet, Take 20 mg by mouth daily., Disp: , Rfl:  .  celecoxib (CELEBREX) 200 MG capsule, Take 200 mg by mouth daily., Disp: , Rfl:  .  Cholecalciferol (VITAMIN D) 2000 UNITS tablet, Take 2,000 Units by mouth daily., Disp: , Rfl:  .  cycloSPORINE (RESTASIS) 0.05 % ophthalmic emulsion, Place 1 drop into both eyes 2 (two) times daily., Disp: , Rfl:  .  docusate sodium (COLACE) 100 MG capsule, 1 tab 2 times a day while on narcotics.  STOOL SOFTENER, Disp: 60 capsule, Rfl: 0 .  metFORMIN (GLUCOPHAGE) 500 MG tablet, Take 500 mg by mouth 2 (two) times daily with a meal., Disp: , Rfl:  .  olmesartan-hydrochlorothiazide (BENICAR HCT) 40-25 MG per tablet, Take 1 tablet by mouth daily., Disp: , Rfl:  .  oxyCODONE (OXY IR/ROXICODONE) 5 MG immediate release tablet, 1-2 tablets every 4-6 hrs as needed for pain, Disp: 100 tablet, Rfl: 0 .  polyethylene glycol (MIRALAX / GLYCOLAX) packet, 17grams in 16 oz of water twice a day until bowel movement.  LAXITIVE.  Restart if two days since last bowel movement, Disp: 60 each, Rfl: 0 .  Polyvinyl Alcohol (LIQUID TEARS OP),  Apply 1 drop to eye daily as needed., Disp: , Rfl:  .  White Petrolatum-Mineral Oil (GENTEAL PM OP), Apply 1 drop to eye at bedtime. Preservative Free, Disp: , Rfl:      No Known Allergies    Social History  Substance Use Topics  . Smoking status: Former Smoker    Quit date: 01/25/1987  . Smokeless tobacco: Not on file     Comment: quit smoking in 1988  . Alcohol Use: 1.2 oz/week    2 Standard drinks or equivalent per week     Comment: socially    Family History  Problem Relation Age of Onset  . Hypertension    . Heart disease       Review of Systems  Constitutional: Negative.   HENT: Negative.   Eyes: Negative.   Respiratory: Negative.   Cardiovascular: Negative.   Gastrointestinal: Negative.   Genitourinary: Negative.   Musculoskeletal: Positive for joint pain.  Skin: Negative.   Neurological: Negative.   Endo/Heme/Allergies: Negative.   Psychiatric/Behavioral: Negative.     Objective:  Physical Exam  Constitutional: She is oriented to person, place, and time. She appears well-developed and well-nourished.  HENT:  Head: Normocephalic and atraumatic.  Mouth/Throat: Oropharynx is clear and moist.  Eyes: Conjunctivae and EOM are normal. Pupils are equal, round, and reactive to light.  Neck: Normal range of motion. Neck supple.  Cardiovascular: Normal rate and regular rhythm.   Respiratory: Effort normal and breath sounds normal.  GI: Bowel sounds are normal.  Genitourinary:  Not pertinent to current symptomatology therefore not examined.  Musculoskeletal:  Well-developed, well-nourished white female in no acute distress. Alert and oriented. Examination of her right knee reveals the incision is healing well.  Decreased swelling, decreased pain.  The range of motion is 0-120 degrees. The knee is stable.  Examination of her left knee reveals a 1+ crepitation and 1+ synovitis. Mild varus deformity. Mild pain.  The range of motion is 0-120 degrees.  The knee is stable  with normal patellar tracking  Neurological: She is alert and oriented to person, place, and time.  Skin: Skin is warm and dry.  Psychiatric: She has a normal mood and affect. Her behavior is normal.    Vital signs in last 24 hours: Temp:  [97.9 F (36.6 C)] 97.9 F (36.6 C) (11/09 1500) Pulse Rate:  [93] 93 (11/09 1500) BP: (156)/(72) 156/72 mmHg (11/09 1500) SpO2:  [97 %] 97 % (11/09 1500) Weight:  [105.235 kg (232 lb)] 105.235 kg (232 lb) (11/09 1500)  Labs:   Estimated body mass index is 37.46 kg/(m^2) as calculated from the following:   Height as of this encounter: 5\' 6"  (1.676 m).   Weight as of this encounter: 105.235 kg (232 lb).   Imaging Review Plain radiographs demonstrate severe degenerative joint disease of the left knee(s). The overall alignment ismild varus.  The bone quality appears to be good for age and reported activity level.  Assessment/Plan:  End stage arthritis, left knee  Principal Problem:   Primary localized osteoarthritis of left knee Active Problems:   Diabetes mellitus without complication ([facility])   Hypertension   Hyperlipidemia   Obesity   Sleep apnea   Primary localized osteoarthritis of right knee   DJD (degenerative joint disease) of knee   The patient history, physical examination, clinical judgment of the provider and imaging studies are consistent with end stage degenerative joint disease of the left knee(s) and total knee arthroplasty is deemed medically necessary. The treatment options including medical management, injection therapy arthroscopy and arthroplasty were discussed at length. The risks and benefits of total knee arthroplasty were presented and reviewed. The risks due to aseptic loosening, infection, stiffness, patella tracking problems, thromboembolic complications and other imponderables were discussed. The patient acknowledged the explanation, agreed to proceed with the plan and consent was signed. Patient is being admitted for  inpatient treatment for surgery, pain control, PT, OT, prophylactic antibiotics, VTE prophylaxis, progressive ambulation and ADL's and discharge planning. The patient is planning to be discharged home with home health services

## 2015-11-05 ENCOUNTER — Encounter (HOSPITAL_COMMUNITY): Payer: Self-pay

## 2015-11-05 ENCOUNTER — Encounter (HOSPITAL_COMMUNITY)
Admission: RE | Admit: 2015-11-05 | Discharge: 2015-11-05 | Disposition: A | Discharge status: Home / Self Care | Payer: Medicare Other | Source: Ambulatory Visit | Attending: Orthopedic Surgery | Admitting: Orthopedic Surgery

## 2015-11-05 ENCOUNTER — Other Ambulatory Visit (HOSPITAL_COMMUNITY): Payer: Self-pay | Admitting: *Deleted

## 2015-11-05 DIAGNOSIS — Z96651 Presence of right artificial knee joint: Secondary | ICD-10-CM | POA: Diagnosis not present

## 2015-11-05 DIAGNOSIS — Z7984 Long term (current) use of oral hypoglycemic drugs: Secondary | ICD-10-CM | POA: Diagnosis not present

## 2015-11-05 DIAGNOSIS — Z0183 Encounter for blood typing: Secondary | ICD-10-CM | POA: Diagnosis not present

## 2015-11-05 DIAGNOSIS — E785 Hyperlipidemia, unspecified: Secondary | ICD-10-CM | POA: Insufficient documentation

## 2015-11-05 DIAGNOSIS — E119 Type 2 diabetes mellitus without complications: Secondary | ICD-10-CM | POA: Diagnosis not present

## 2015-11-05 DIAGNOSIS — Z79899 Other long term (current) drug therapy: Secondary | ICD-10-CM | POA: Insufficient documentation

## 2015-11-05 DIAGNOSIS — Z01818 Encounter for other preprocedural examination: Secondary | ICD-10-CM | POA: Insufficient documentation

## 2015-11-05 DIAGNOSIS — Z87891 Personal history of nicotine dependence: Secondary | ICD-10-CM | POA: Insufficient documentation

## 2015-11-05 DIAGNOSIS — M1712 Unilateral primary osteoarthritis, left knee: Secondary | ICD-10-CM | POA: Diagnosis not present

## 2015-11-05 DIAGNOSIS — I10 Essential (primary) hypertension: Secondary | ICD-10-CM | POA: Insufficient documentation

## 2015-11-05 DIAGNOSIS — Z7982 Long term (current) use of aspirin: Secondary | ICD-10-CM | POA: Diagnosis not present

## 2015-11-05 DIAGNOSIS — H3530 Unspecified macular degeneration: Secondary | ICD-10-CM | POA: Diagnosis not present

## 2015-11-05 DIAGNOSIS — Z01812 Encounter for preprocedural laboratory examination: Secondary | ICD-10-CM | POA: Diagnosis not present

## 2015-11-05 DIAGNOSIS — G4733 Obstructive sleep apnea (adult) (pediatric): Secondary | ICD-10-CM | POA: Diagnosis not present

## 2015-11-05 HISTORY — DX: Unspecified asthma, uncomplicated: J45.909

## 2015-11-05 LAB — CBC WITH DIFFERENTIAL/PLATELET
BASOS ABS: 0 10*3/uL (ref 0.0–0.1)
BASOS PCT: 0 %
EOS ABS: 0 10*3/uL (ref 0.0–0.7)
Eosinophils Relative: 0 %
HCT: 36.2 % (ref 36.0–46.0)
HEMOGLOBIN: 11.8 g/dL — AB (ref 12.0–15.0)
LYMPHS ABS: 1.6 10*3/uL (ref 0.7–4.0)
Lymphocytes Relative: 13 %
MCH: 30.4 pg (ref 26.0–34.0)
MCHC: 32.6 g/dL (ref 30.0–36.0)
MCV: 93.3 fL (ref 78.0–100.0)
Monocytes Absolute: 0.1 10*3/uL (ref 0.1–1.0)
Monocytes Relative: 1 %
NEUTROS PCT: 86 %
Neutro Abs: 10.5 10*3/uL — ABNORMAL HIGH (ref 1.7–7.7)
PLATELETS: 237 10*3/uL (ref 150–400)
RBC: 3.88 MIL/uL (ref 3.87–5.11)
RDW: 13.7 % (ref 11.5–15.5)
WBC: 12.3 10*3/uL — AB (ref 4.0–10.5)

## 2015-11-05 LAB — TYPE AND SCREEN
ABO/RH(D): A POS
Antibody Screen: NEGATIVE

## 2015-11-05 LAB — COMPREHENSIVE METABOLIC PANEL
ALBUMIN: 4.1 g/dL (ref 3.5–5.0)
ALK PHOS: 61 U/L (ref 38–126)
ALT: 28 U/L (ref 14–54)
AST: 24 U/L (ref 15–41)
Anion gap: 10 (ref 5–15)
BUN: 27 mg/dL — AB (ref 6–20)
CALCIUM: 10.1 mg/dL (ref 8.9–10.3)
CHLORIDE: 108 mmol/L (ref 101–111)
CO2: 21 mmol/L — AB (ref 22–32)
CREATININE: 1 mg/dL (ref 0.44–1.00)
GFR calc Af Amer: >60 mL/min (ref >60)
GFR calc non Af Amer: 55 mL/min — ABNORMAL LOW (ref >60)
GLUCOSE: 159 mg/dL — AB (ref 65–99)
Potassium: 3.6 mmol/L (ref 3.5–5.1)
SODIUM: 139 mmol/L (ref 135–145)
Total Bilirubin: 0.6 mg/dL (ref 0.3–1.2)
Total Protein: 7.7 g/dL (ref 6.5–8.1)

## 2015-11-05 LAB — APTT: APTT: 27 s (ref 24–37)

## 2015-11-05 LAB — PROTIME-INR
INR: 1.17 (ref 0.00–1.49)
Prothrombin Time: 15 seconds (ref 11.6–15.2)

## 2015-11-05 LAB — SURGICAL PCR SCREEN
MRSA, PCR: NEGATIVE
Staphylococcus aureus: POSITIVE — AB

## 2015-11-05 LAB — GLUCOSE, CAPILLARY: Glucose-Capillary: 138 mg/dL — ABNORMAL HIGH (ref 65–99)

## 2015-11-05 NOTE — Progress Notes (Signed)
Mupirocin Ointment Rx called into [facility] for positive PCR of Staph. Left message on pt's voicemail informing her of results and need to pick up Rx.

## 2015-11-05 NOTE — Progress Notes (Signed)
Pt denies cardiac history, chest pain or sob.  Pt's cbg today was 138. Her last A1C was in July, 2016, will get another one today. She does not check her blood sugar at home because "I'm only on Metformin and what am I going to do if it's not normal. That's what the A1C is for".   Pt does request to have Dr. Linna Caprice as her anesthesiologist, I told her that I would put the request in but I couldn't say for sure that he would be working the day of her surgery. She voiced understanding. Request given to Merilynn Finland, PA

## 2015-11-05 NOTE — Pre-Procedure Instructions (Signed)
JENNIFIER DVORAK  11/05/2015     Your procedure is scheduled on Monday, November 15, 2015 at 7:15 AM.   Report to [facility] Entrance "A" Admitting Office at 5:30 AM.   Call this number if you have problems the morning of surgery: [phone]   Any questions prior to day of surgery, please call [phone] between 8 & 4 PM.   Remember:  Do not eat food or drink liquids after midnight Sunday, 11/14/15.  Take these medicines the morning of surgery : Eye drops  How to Manage Your Diabetes Before Surgery   Why is it important to control my blood sugar before and after surgery?   Improving blood sugar levels before and after surgery helps healing and can limit problems.  A way of improving blood sugar control is eating a healthy diet by:  - Eating less sugar and carbohydrates  - Increasing activity/exercise  - Talk with your doctor about reaching your blood sugar goals  High blood sugars (greater than 180 mg/dL) can raise your risk of infections and slow down your recovery so you will need to focus on controlling your diabetes during the weeks before surgery.  Make sure that the doctor who takes care of your diabetes knows about your planned surgery including the date and location.  How do I manage my blood sugars before surgery?   Check your blood sugar at least 4 times a day, 2 days before surgery to make sure that they are not too high or low.   Check your blood sugar the morning of your surgery when you wake up and every 2 hours until you get to the Short-Stay unit.   Treat a low blood sugar (less than 70 mg/dL) with 1/2 cup of clear juice (cranberry or apple), 4 glucose tablets, OR glucose gel.   Recheck blood sugar in 15 minutes after treatment (to make sure it is greater than 70 mg/dL).  If blood sugar is not greater than 70 mg/dL on re-check, call [phone] for further instructions.    Report your blood sugar to the Short-Stay nurse when you  get to Short-Stay.  References:  [facility], 2007 "How to Manage your Diabetes Before and After Surgery".  What do I do about my diabetes medications?   Do not take oral diabetes medicines (pills) the morning of surgery.  Stop Aspirin, NSAIDS (Celebrex, Ibuprofen, Aleve, etc.) and Vitamins 7 days prior to surgery.   Do not wear jewelry, make-up or nail polish.  Do not wear lotions, powders, or perfumes.  You may wear deodorant.  Do not shave 48 hours prior to surgery.    Do not bring valuables to the hospital.  [facility] is not responsible for any belongings or valuables.  Contacts, dentures or bridgework may not be worn into surgery.  Leave your suitcase in the car.  After surgery it may be brought to your room.  For patients admitted to the hospital, discharge time will be determined by your treatment team.  Special instructions:  [facility] - Preparing for Surgery  Before surgery, you can play an important role.  Because skin is not sterile, your skin needs to be as free of germs as possible.  You can reduce the number of germs on you skin by washing with CHG (chlorahexidine gluconate) soap before surgery.  CHG is an antiseptic cleaner which kills germs and bonds with the skin to continue killing germs even after washing.  Please DO  NOT use if you have an allergy to CHG or antibacterial soaps.  If your skin becomes reddened/irritated stop using the CHG and inform your nurse when you arrive at Short Stay.  Do not shave (including legs and underarms) for at least 48 hours prior to the first CHG shower.  You may shave your face.  Please follow these instructions carefully:   1.  Shower with CHG Soap the night before surgery and the                                morning of Surgery.  2.  If you choose to wash your hair, wash your hair first as usual with your       normal shampoo.  3.  After you shampoo, rinse your hair and body thoroughly to remove the                       Shampoo.  4.  Use CHG as you would any other liquid soap.  You can apply chg directly       to the skin and wash gently with scrungie or a clean washcloth.  5.  Apply the CHG Soap to your body ONLY FROM THE NECK DOWN.        Do not use on open wounds or open sores.  Avoid contact with your eyes, ears, mouth and genitals (private parts).  Wash genitals (private parts) with your normal soap.  6.  Wash thoroughly, paying special attention to the area where your surgery        will be performed.  7.  Thoroughly rinse your body with warm water from the neck down.  8.  DO NOT shower/wash with your normal soap after using and rinsing off       the CHG Soap.  9.  Pat yourself dry with a clean towel.            10.  Wear clean pajamas.            11.  Place clean sheets on your bed the night of your first shower and do not        sleep with pets.  Day of Surgery  Do not apply any lotions the morning of surgery.  Please wear clean clothes to the hospital.  Please read over the following fact sheets that you were given. Pain Booklet, Coughing and Deep Breathing, Blood Transfusion Information, MRSA Information and Surgical Site Infection Prevention

## 2015-11-06 LAB — HEMOGLOBIN A1C
Hgb A1c MFr Bld: 6.9 % — ABNORMAL HIGH (ref 4.8–5.6)
MEAN PLASMA GLUCOSE: 151 mg/dL

## 2015-11-06 LAB — URINE CULTURE

## 2015-11-08 NOTE — Progress Notes (Signed)
Anesthesia Note: See my note from 07/22/15 with addendum for 07/27/15. She underwent right TKA on 08/02/15 with MAC and spinal. She is now scheduled for left TKA on 11/15/15. Preoperative labs noted.   Patient has requested anesthesiologist Dr. Linna Caprice if he is working that day. I did write the request on the anesthesiologist communication board, but unless there is a schedule change then it did not appear that he would be at [facility] on that day.  George Hugh [facility] Short Stay Center/Anesthesiology Phone [phone] 11/08/2015 2:04 PM

## 2015-11-11 DIAGNOSIS — Z96651 Presence of right artificial knee joint: Secondary | ICD-10-CM | POA: Diagnosis not present

## 2015-11-12 MED ORDER — CHLORHEXIDINE GLUCONATE 4 % EX LIQD: 60.0000 mL | Freq: Once | CUTANEOUS | Status: DC

## 2015-11-12 MED ORDER — POVIDONE-IODINE 7.5 % EX SOLN
Freq: Once | CUTANEOUS | Status: DC
  Filled 2015-11-12: qty 118

## 2015-11-12 MED ORDER — LACTATED RINGERS IV SOLN: INTRAVENOUS | Status: DC

## 2015-11-12 MED ORDER — CEFAZOLIN SODIUM-DEXTROSE 2-3 GM-% IV SOLR
2.0000 g | INTRAVENOUS | Status: AC
  Administered 2015-11-15: 2 g via INTRAVENOUS
  Filled 2015-11-12: qty 50

## 2015-11-15 ENCOUNTER — Encounter (HOSPITAL_COMMUNITY)
Admission: RE | Disposition: A | Discharge status: Home Health | Payer: Self-pay | Source: Ambulatory Visit | Attending: Orthopedic Surgery

## 2015-11-15 ENCOUNTER — Inpatient Hospital Stay (HOSPITAL_COMMUNITY): Payer: Medicare Other | Admitting: Vascular Surgery

## 2015-11-15 ENCOUNTER — Inpatient Hospital Stay (HOSPITAL_COMMUNITY)
Admission: RE | Admit: 2015-11-15 | Discharge: 2015-11-16 | DRG: 470 | Disposition: A | Discharge status: Home Health | Payer: Medicare Other | Source: Ambulatory Visit | Attending: Orthopedic Surgery | Admitting: Orthopedic Surgery

## 2015-11-15 ENCOUNTER — Inpatient Hospital Stay (HOSPITAL_COMMUNITY): Payer: Medicare Other | Admitting: Certified Registered"

## 2015-11-15 ENCOUNTER — Encounter (HOSPITAL_COMMUNITY): Payer: Self-pay | Admitting: General Practice

## 2015-11-15 DIAGNOSIS — M1711 Unilateral primary osteoarthritis, right knee: Secondary | ICD-10-CM | POA: Diagnosis present

## 2015-11-15 DIAGNOSIS — M1710 Unilateral primary osteoarthritis, unspecified knee: Secondary | ICD-10-CM | POA: Diagnosis present

## 2015-11-15 DIAGNOSIS — Z6837 Body mass index (BMI) 37.0-37.9, adult: Secondary | ICD-10-CM | POA: Diagnosis not present

## 2015-11-15 DIAGNOSIS — M179 Osteoarthritis of knee, unspecified: Secondary | ICD-10-CM | POA: Diagnosis not present

## 2015-11-15 DIAGNOSIS — Z7984 Long term (current) use of oral hypoglycemic drugs: Secondary | ICD-10-CM

## 2015-11-15 DIAGNOSIS — M25562 Pain in left knee: Secondary | ICD-10-CM | POA: Diagnosis not present

## 2015-11-15 DIAGNOSIS — E119 Type 2 diabetes mellitus without complications: Secondary | ICD-10-CM | POA: Diagnosis not present

## 2015-11-15 DIAGNOSIS — Z791 Long term (current) use of non-steroidal anti-inflammatories (NSAID): Secondary | ICD-10-CM | POA: Diagnosis not present

## 2015-11-15 DIAGNOSIS — H3530 Unspecified macular degeneration: Secondary | ICD-10-CM | POA: Diagnosis present

## 2015-11-15 DIAGNOSIS — E669 Obesity, unspecified: Secondary | ICD-10-CM | POA: Diagnosis present

## 2015-11-15 DIAGNOSIS — Z87891 Personal history of nicotine dependence: Secondary | ICD-10-CM

## 2015-11-15 DIAGNOSIS — G8918 Other acute postprocedural pain: Secondary | ICD-10-CM | POA: Diagnosis not present

## 2015-11-15 DIAGNOSIS — I10 Essential (primary) hypertension: Secondary | ICD-10-CM | POA: Diagnosis present

## 2015-11-15 DIAGNOSIS — G4730 Sleep apnea, unspecified: Secondary | ICD-10-CM | POA: Diagnosis present

## 2015-11-15 DIAGNOSIS — M1712 Unilateral primary osteoarthritis, left knee: Secondary | ICD-10-CM | POA: Diagnosis not present

## 2015-11-15 DIAGNOSIS — J45909 Unspecified asthma, uncomplicated: Secondary | ICD-10-CM | POA: Diagnosis present

## 2015-11-15 DIAGNOSIS — E785 Hyperlipidemia, unspecified: Secondary | ICD-10-CM | POA: Diagnosis not present

## 2015-11-15 DIAGNOSIS — M170 Bilateral primary osteoarthritis of knee: Principal | ICD-10-CM | POA: Diagnosis present

## 2015-11-15 DIAGNOSIS — Z7901 Long term (current) use of anticoagulants: Secondary | ICD-10-CM | POA: Diagnosis not present

## 2015-11-15 DIAGNOSIS — H409 Unspecified glaucoma: Secondary | ICD-10-CM | POA: Diagnosis present

## 2015-11-15 HISTORY — PX: TOTAL KNEE ARTHROPLASTY: SHX125

## 2015-11-15 LAB — GLUCOSE, CAPILLARY
GLUCOSE-CAPILLARY: 134 mg/dL — AB (ref 65–99)
GLUCOSE-CAPILLARY: 162 mg/dL — AB (ref 65–99)
GLUCOSE-CAPILLARY: 211 mg/dL — AB (ref 65–99)
Glucose-Capillary: 121 mg/dL — ABNORMAL HIGH (ref 65–99)
Glucose-Capillary: 198 mg/dL — ABNORMAL HIGH (ref 65–99)
Glucose-Capillary: 205 mg/dL — ABNORMAL HIGH (ref 65–99)

## 2015-11-15 SURGERY — ARTHROPLASTY, KNEE, TOTAL
Anesthesia: Spinal | Site: Knee | Laterality: Left

## 2015-11-15 MED ORDER — APIXABAN 2.5 MG PO TABS
2.5000 mg | ORAL_TABLET | Freq: Two times a day (BID) | ORAL | Status: DC
  Administered 2015-11-16: 2.5 mg via ORAL
  Filled 2015-11-15: qty 1

## 2015-11-15 MED ORDER — IRBESARTAN 300 MG PO TABS
300.0000 mg | ORAL_TABLET | Freq: Every day | ORAL | Status: DC
  Administered 2015-11-16: 300 mg via ORAL
  Filled 2015-11-15 (×2): qty 1

## 2015-11-15 MED ORDER — PROMETHAZINE HCL 25 MG/ML IJ SOLN: 6.2500 mg | INTRAMUSCULAR | Status: DC | PRN

## 2015-11-15 MED ORDER — PHENYLEPHRINE HCL 10 MG/ML IJ SOLN
INTRAMUSCULAR | Status: DC | PRN
  Administered 2015-11-15: 40 ug via INTRAVENOUS
  Administered 2015-11-15 (×2): 80 ug via INTRAVENOUS

## 2015-11-15 MED ORDER — BUPIVACAINE-EPINEPHRINE 0.25% -1:200000 IJ SOLN
INTRAMUSCULAR | Status: DC | PRN
  Administered 2015-11-15: 30 mL

## 2015-11-15 MED ORDER — ONDANSETRON HCL 4 MG PO TABS: 4.0000 mg | ORAL_TABLET | Freq: Four times a day (QID) | ORAL | Status: DC | PRN

## 2015-11-15 MED ORDER — ACETAMINOPHEN 325 MG PO TABS: 650.0000 mg | ORAL_TABLET | Freq: Four times a day (QID) | ORAL | Status: DC | PRN

## 2015-11-15 MED ORDER — HYDROMORPHONE HCL 1 MG/ML IJ SOLN
1.0000 mg | INTRAMUSCULAR | Status: DC | PRN
Start: 2015-11-15 — End: 2015-11-16

## 2015-11-15 MED ORDER — LACTATED RINGERS IV SOLN
INTRAVENOUS | Status: DC | PRN
  Administered 2015-11-15: 07:00:00 via INTRAVENOUS

## 2015-11-15 MED ORDER — ONDANSETRON HCL 4 MG/2ML IJ SOLN: 4.0000 mg | Freq: Four times a day (QID) | INTRAMUSCULAR | Status: DC | PRN

## 2015-11-15 MED ORDER — HYDROCHLOROTHIAZIDE 25 MG PO TABS
25.0000 mg | ORAL_TABLET | Freq: Every day | ORAL | Status: DC
  Administered 2015-11-16: 25 mg via ORAL
  Filled 2015-11-15 (×2): qty 1

## 2015-11-15 MED ORDER — ACETAZOLAMIDE ER 500 MG PO CP12
500.0000 mg | ORAL_CAPSULE | Freq: Every day | ORAL | Status: DC
  Administered 2015-11-15 – 2015-11-16 (×2): 500 mg via ORAL
  Filled 2015-11-15 (×2): qty 1

## 2015-11-15 MED ORDER — ACETAMINOPHEN 650 MG RE SUPP: 650.0000 mg | Freq: Four times a day (QID) | RECTAL | Status: DC | PRN

## 2015-11-15 MED ORDER — BUPIVACAINE-EPINEPHRINE (PF) 0.5% -1:200000 IJ SOLN
INTRAMUSCULAR | Status: DC | PRN
  Administered 2015-11-15: 20 mL via PERINEURAL

## 2015-11-15 MED ORDER — MENTHOL 3 MG MT LOZG: 1.0000 | LOZENGE | OROMUCOSAL | Status: DC | PRN

## 2015-11-15 MED ORDER — LIDOCAINE HCL (CARDIAC) 20 MG/ML IV SOLN
INTRAVENOUS | Status: DC | PRN
  Administered 2015-11-15: 20 mg via INTRAVENOUS

## 2015-11-15 MED ORDER — CEFUROXIME SODIUM 1.5 G IJ SOLR
INTRAMUSCULAR | Status: DC | PRN
  Administered 2015-11-15: 1.5 g

## 2015-11-15 MED ORDER — VITAMIN D 50 MCG (2000 UT) PO TABS: 2000.0000 [IU] | ORAL_TABLET | Freq: Every day | ORAL | Status: DC

## 2015-11-15 MED ORDER — INSULIN GLARGINE 100 UNIT/ML ~~LOC~~ SOLN
15.0000 [IU] | Freq: Every day | SUBCUTANEOUS | Status: DC
  Administered 2015-11-15: 15 [IU] via SUBCUTANEOUS
  Filled 2015-11-15 (×2): qty 0.15

## 2015-11-15 MED ORDER — DEXAMETHASONE SODIUM PHOSPHATE 10 MG/ML IJ SOLN
INTRAMUSCULAR | Status: DC | PRN
  Administered 2015-11-15: 10 mg via INTRAVENOUS

## 2015-11-15 MED ORDER — SODIUM CHLORIDE 0.9 % IR SOLN
Status: DC | PRN
  Administered 2015-11-15: 3000 mL
  Administered 2015-11-15: 1000 mL

## 2015-11-15 MED ORDER — CEFUROXIME SODIUM 1.5 G IJ SOLR
INTRAMUSCULAR | Status: AC
  Filled 2015-11-15: qty 1.5

## 2015-11-15 MED ORDER — FENTANYL CITRATE (PF) 250 MCG/5ML IJ SOLN
INTRAMUSCULAR | Status: AC
  Filled 2015-11-15: qty 5

## 2015-11-15 MED ORDER — PROPOFOL 10 MG/ML IV BOLUS
INTRAVENOUS | Status: AC
  Filled 2015-11-15: qty 20

## 2015-11-15 MED ORDER — POLYETHYLENE GLYCOL 3350 17 G PO PACK
17.0000 g | PACK | Freq: Two times a day (BID) | ORAL | Status: DC
  Administered 2015-11-15: 17 g via ORAL
  Filled 2015-11-15 (×2): qty 1

## 2015-11-15 MED ORDER — PHENYLEPHRINE HCL 10 MG/ML IJ SOLN
10.0000 mg | INTRAVENOUS | Status: DC | PRN
  Administered 2015-11-15: 25 ug/min via INTRAVENOUS

## 2015-11-15 MED ORDER — POTASSIUM CHLORIDE IN NACL 20-0.9 MEQ/L-% IV SOLN
INTRAVENOUS | Status: DC
  Administered 2015-11-15 – 2015-11-16 (×2): via INTRAVENOUS
  Filled 2015-11-15 (×2): qty 1000

## 2015-11-15 MED ORDER — ATORVASTATIN CALCIUM 20 MG PO TABS
20.0000 mg | ORAL_TABLET | Freq: Every day | ORAL | Status: DC
  Administered 2015-11-15 – 2015-11-16 (×2): 20 mg via ORAL
  Filled 2015-11-15 (×2): qty 1

## 2015-11-15 MED ORDER — DEXAMETHASONE SODIUM PHOSPHATE 10 MG/ML IJ SOLN
10.0000 mg | Freq: Three times a day (TID) | INTRAMUSCULAR | Status: AC
  Administered 2015-11-15 – 2015-11-16 (×3): 10 mg via INTRAVENOUS
  Filled 2015-11-15 (×3): qty 1

## 2015-11-15 MED ORDER — DIPHENHYDRAMINE HCL 12.5 MG/5ML PO ELIX: 12.5000 mg | ORAL_SOLUTION | ORAL | Status: DC | PRN

## 2015-11-15 MED ORDER — MIDAZOLAM HCL 2 MG/2ML IJ SOLN
INTRAMUSCULAR | Status: AC
  Filled 2015-11-15: qty 2

## 2015-11-15 MED ORDER — CEFAZOLIN SODIUM-DEXTROSE 2-3 GM-% IV SOLR
2.0000 g | Freq: Four times a day (QID) | INTRAVENOUS | Status: AC
  Administered 2015-11-15 (×2): 2 g via INTRAVENOUS
  Filled 2015-11-15 (×3): qty 50

## 2015-11-15 MED ORDER — LACTATED RINGERS IV SOLN: INTRAVENOUS | Status: DC

## 2015-11-15 MED ORDER — PHENOL 1.4 % MT LIQD: 1.0000 | OROMUCOSAL | Status: DC | PRN

## 2015-11-15 MED ORDER — DOCUSATE SODIUM 100 MG PO CAPS
100.0000 mg | ORAL_CAPSULE | Freq: Two times a day (BID) | ORAL | Status: DC
  Administered 2015-11-15 – 2015-11-16 (×3): 100 mg via ORAL
  Filled 2015-11-15 (×3): qty 1

## 2015-11-15 MED ORDER — BUPIVACAINE-EPINEPHRINE (PF) 0.25% -1:200000 IJ SOLN
INTRAMUSCULAR | Status: AC
  Filled 2015-11-15: qty 30

## 2015-11-15 MED ORDER — FENTANYL CITRATE (PF) 250 MCG/5ML IJ SOLN
INTRAMUSCULAR | Status: DC | PRN
  Administered 2015-11-15 (×2): 50 ug via INTRAVENOUS

## 2015-11-15 MED ORDER — METOCLOPRAMIDE HCL 5 MG PO TABS: 5.0000 mg | ORAL_TABLET | Freq: Three times a day (TID) | ORAL | Status: DC | PRN

## 2015-11-15 MED ORDER — VITAMIN D 1000 UNITS PO TABS
2000.0000 [IU] | ORAL_TABLET | Freq: Every day | ORAL | Status: DC
  Administered 2015-11-16: 2000 [IU] via ORAL
  Filled 2015-11-15 (×2): qty 2

## 2015-11-15 MED ORDER — INSULIN ASPART 100 UNIT/ML ~~LOC~~ SOLN
4.0000 [IU] | Freq: Three times a day (TID) | SUBCUTANEOUS | Status: DC
  Administered 2015-11-15 – 2015-11-16 (×2): 4 [IU] via SUBCUTANEOUS

## 2015-11-15 MED ORDER — ALUM & MAG HYDROXIDE-SIMETH 200-200-20 MG/5ML PO SUSP: 30.0000 mL | ORAL | Status: DC | PRN

## 2015-11-15 MED ORDER — INSULIN ASPART 100 UNIT/ML ~~LOC~~ SOLN
0.0000 [IU] | Freq: Three times a day (TID) | SUBCUTANEOUS | Status: DC
  Administered 2015-11-15 – 2015-11-16 (×3): 4 [IU] via SUBCUTANEOUS

## 2015-11-15 MED ORDER — MIDAZOLAM HCL 5 MG/5ML IJ SOLN
INTRAMUSCULAR | Status: DC | PRN
  Administered 2015-11-15 (×2): 1 mg via INTRAVENOUS

## 2015-11-15 MED ORDER — METOCLOPRAMIDE HCL 5 MG/ML IJ SOLN: 5.0000 mg | Freq: Three times a day (TID) | INTRAMUSCULAR | Status: DC | PRN

## 2015-11-15 MED ORDER — OLMESARTAN MEDOXOMIL-HCTZ 40-25 MG PO TABS
1.0000 | ORAL_TABLET | Freq: Every day | ORAL | Status: DC
Start: 2015-11-17 — End: 2015-11-15

## 2015-11-15 MED ORDER — MEPERIDINE HCL 25 MG/ML IJ SOLN: 6.2500 mg | INTRAMUSCULAR | Status: DC | PRN

## 2015-11-15 MED ORDER — PROPOFOL 500 MG/50ML IV EMUL
INTRAVENOUS | Status: DC | PRN
  Administered 2015-11-15: 100 ug/kg/min via INTRAVENOUS

## 2015-11-15 MED ORDER — HYDROMORPHONE HCL 1 MG/ML IJ SOLN: 0.2500 mg | INTRAMUSCULAR | Status: DC | PRN

## 2015-11-15 MED ORDER — CELECOXIB 200 MG PO CAPS
200.0000 mg | ORAL_CAPSULE | Freq: Two times a day (BID) | ORAL | Status: DC
  Administered 2015-11-15 – 2015-11-16 (×3): 200 mg via ORAL
  Filled 2015-11-15 (×3): qty 1

## 2015-11-15 MED ORDER — CYCLOSPORINE 0.05 % OP EMUL
1.0000 [drp] | Freq: Two times a day (BID) | OPHTHALMIC | Status: DC
  Administered 2015-11-15: 1 [drp] via OPHTHALMIC
  Filled 2015-11-15 (×4): qty 1

## 2015-11-15 MED ORDER — OXYCODONE HCL 5 MG PO TABS
5.0000 mg | ORAL_TABLET | ORAL | Status: DC | PRN
  Administered 2015-11-15 – 2015-11-16 (×6): 10 mg via ORAL
  Filled 2015-11-15 (×6): qty 2

## 2015-11-15 MED ORDER — INSULIN ASPART 100 UNIT/ML ~~LOC~~ SOLN
0.0000 [IU] | Freq: Every day | SUBCUTANEOUS | Status: DC
  Administered 2015-11-15: 2 [IU] via SUBCUTANEOUS

## 2015-11-15 SURGICAL SUPPLY — 75 items
BANDAGE ELASTIC 6 VELCRO ST LF (GAUZE/BANDAGES/DRESSINGS) ×3 IMPLANT
BANDAGE ESMARK 6X9 LF (GAUZE/BANDAGES/DRESSINGS) ×1 IMPLANT
BENZOIN TINCTURE PRP APPL 2/3 (GAUZE/BANDAGES/DRESSINGS) ×3 IMPLANT
BLADE SAGITTAL 25.0X1.19X90 (BLADE) ×2 IMPLANT
BLADE SAGITTAL 25.0X1.19X90MM (BLADE) ×1
BLADE SAW SAG 90X13X1.27 (BLADE) ×3 IMPLANT
BLADE SAW SGTL 13.0X1.19X90.0M (BLADE) ×3 IMPLANT
BLADE SURG 10 STRL SS (BLADE) ×3 IMPLANT
BNDG ELASTIC 6X15 VLCR STRL LF (GAUZE/BANDAGES/DRESSINGS) ×3 IMPLANT
BNDG ESMARK 6X9 LF (GAUZE/BANDAGES/DRESSINGS) ×3
BOWL SMART MIX CTS (DISPOSABLE) ×3 IMPLANT
CAPT KNEE TOTAL 3 ATTUNE ×3 IMPLANT
CEMENT HV SMART SET (Cement) ×6 IMPLANT
CLOSURE STERI-STRIP 1/2X4 (GAUZE/BANDAGES/DRESSINGS) ×1
CLOSURE WOUND 1/2 X4 (GAUZE/BANDAGES/DRESSINGS) ×1
CLSR STERI-STRIP ANTIMIC 1/2X4 (GAUZE/BANDAGES/DRESSINGS) ×2 IMPLANT
COVER SURGICAL LIGHT HANDLE (MISCELLANEOUS) ×3 IMPLANT
CUFF TOURNIQUET SINGLE 34IN LL (TOURNIQUET CUFF) ×3 IMPLANT
DECANTER SPIKE VIAL GLASS SM (MISCELLANEOUS) ×3 IMPLANT
DRAPE EXTREMITY T 121X128X90 (DRAPE) ×3 IMPLANT
DRAPE PROXIMA HALF (DRAPES) ×3 IMPLANT
DRAPE U-SHAPE 47X51 STRL (DRAPES) ×3 IMPLANT
DRSG AQUACEL AG ADV 3.5X10 (GAUZE/BANDAGES/DRESSINGS) ×3 IMPLANT
DRSG AQUACEL AG ADV 3.5X14 (GAUZE/BANDAGES/DRESSINGS) ×3 IMPLANT
DRSG PAD ABDOMINAL 8X10 ST (GAUZE/BANDAGES/DRESSINGS) ×6 IMPLANT
DURAPREP 26ML APPLICATOR (WOUND CARE) ×6 IMPLANT
ELECT CAUTERY BLADE 6.4 (BLADE) ×3 IMPLANT
ELECT REM PT RETURN 9FT ADLT (ELECTROSURGICAL) ×3
ELECTRODE REM PT RTRN 9FT ADLT (ELECTROSURGICAL) ×1 IMPLANT
EVACUATOR 1/8 PVC DRAIN (DRAIN) ×3 IMPLANT
FACESHIELD WRAPAROUND (MASK) ×6 IMPLANT
GAUZE SPONGE 4X4 12PLY STRL (GAUZE/BANDAGES/DRESSINGS) ×3 IMPLANT
GLOVE BIO SURGEON STRL SZ7 (GLOVE) ×3 IMPLANT
GLOVE BIOGEL PI IND STRL 7.0 (GLOVE) ×4 IMPLANT
GLOVE BIOGEL PI IND STRL 7.5 (GLOVE) ×1 IMPLANT
GLOVE BIOGEL PI INDICATOR 7.0 (GLOVE) ×8
GLOVE BIOGEL PI INDICATOR 7.5 (GLOVE) ×2
GLOVE SS BIOGEL STRL SZ 7.5 (GLOVE) ×1 IMPLANT
GLOVE SUPERSENSE BIOGEL SZ 7.5 (GLOVE) ×2
GLOVE SURG SS PI 7.0 STRL IVOR (GLOVE) ×9 IMPLANT
GOWN STRL REUS W/ TWL LRG LVL3 (GOWN DISPOSABLE) ×1 IMPLANT
GOWN STRL REUS W/ TWL XL LVL3 (GOWN DISPOSABLE) ×2 IMPLANT
GOWN STRL REUS W/TWL LRG LVL3 (GOWN DISPOSABLE) ×2
GOWN STRL REUS W/TWL XL LVL3 (GOWN DISPOSABLE) ×4
HANDPIECE INTERPULSE COAX TIP (DISPOSABLE) ×2
HOOD PEEL AWAY FACE SHEILD DIS (HOOD) ×6 IMPLANT
IMMOBILIZER KNEE 22 (SOFTGOODS) ×3 IMPLANT
IMMOBILIZER KNEE 22 UNIV (SOFTGOODS) ×3 IMPLANT
KIT BASIN OR (CUSTOM PROCEDURE TRAY) ×3 IMPLANT
KIT ROOM TURNOVER OR (KITS) ×3 IMPLANT
MANIFOLD NEPTUNE II (INSTRUMENTS) ×3 IMPLANT
MARKER SKIN DUAL TIP RULER LAB (MISCELLANEOUS) ×3 IMPLANT
NS IRRIG 1000ML POUR BTL (IV SOLUTION) ×3 IMPLANT
PACK TOTAL JOINT (CUSTOM PROCEDURE TRAY) ×3 IMPLANT
PACK UNIVERSAL I (CUSTOM PROCEDURE TRAY) ×3 IMPLANT
PAD ARMBOARD 7.5X6 YLW CONV (MISCELLANEOUS) ×6 IMPLANT
PADDING CAST COTTON 6X4 STRL (CAST SUPPLIES) ×3 IMPLANT
RUBBERBAND STERILE (MISCELLANEOUS) ×3 IMPLANT
SET HNDPC FAN SPRY TIP SCT (DISPOSABLE) ×1 IMPLANT
STRIP CLOSURE SKIN 1/2X4 (GAUZE/BANDAGES/DRESSINGS) ×2 IMPLANT
SUCTION FRAZIER TIP 10 FR DISP (SUCTIONS) ×3 IMPLANT
SUT MNCRL AB 3-0 PS2 18 (SUTURE) ×3 IMPLANT
SUT VIC AB 0 CT1 27 (SUTURE) ×4
SUT VIC AB 0 CT1 27XBRD ANBCTR (SUTURE) ×2 IMPLANT
SUT VIC AB 1 CT1 27 (SUTURE) ×2
SUT VIC AB 1 CT1 27XBRD ANBCTR (SUTURE) ×1 IMPLANT
SUT VIC AB 2-0 CT1 27 (SUTURE) ×4
SUT VIC AB 2-0 CT1 TAPERPNT 27 (SUTURE) ×2 IMPLANT
SYR 30ML SLIP (SYRINGE) ×3 IMPLANT
TOWEL OR 17X24 6PK STRL BLUE (TOWEL DISPOSABLE) ×3 IMPLANT
TOWEL OR 17X26 10 PK STRL BLUE (TOWEL DISPOSABLE) ×3 IMPLANT
TRAY FOLEY CATH 16FR SILVER (SET/KITS/TRAYS/PACK) ×3 IMPLANT
TUBE CONNECTING 12'X1/4 (SUCTIONS) ×1
TUBE CONNECTING 12X1/4 (SUCTIONS) ×2 IMPLANT
YANKAUER SUCT BULB TIP NO VENT (SUCTIONS) ×3 IMPLANT

## 2015-11-15 NOTE — Progress Notes (Signed)
Pt arrived from pacu to unit at 1030; VSS, IV intact and infusing; foley intact and unclamped with leg bag secured; pt LLE incision dsg clean dry and intact, no active bleeding, drainage or stain noted; pt LLE in CPM with ice on dsg. Pt A&O x4; pt oriented to the unit and room; call light within reach. Neuro check wnl; Pt stable during shift and reported off to oncoming RN. Delia Heady RN

## 2015-11-15 NOTE — Interval H&P Note (Signed)
History and Physical Interval Note:  11/15/2015 7:03 AM  Natalie Riddle  has presented today for surgery, with the diagnosis of PRIMARY LOCALIZED OA LEFT KNEE  The various methods of treatment have been discussed with the patient and family. After consideration of risks, benefits and other options for treatment, the patient has consented to  Procedure(s): LEFT TOTAL KNEE ARTHROPLASTY (Left) as a surgical intervention .  The patient's history has been reviewed, patient examined, no change in status, stable for surgery.  I have reviewed the patient's chart and labs.  Questions were answered to the patient's satisfaction.     Elsie Saas A

## 2015-11-15 NOTE — Progress Notes (Signed)
Utilization review completed.  

## 2015-11-15 NOTE — Progress Notes (Signed)
Orthopedic Tech Progress Note Patient Details:  Natalie Riddle 1946/04/10 [id number] Viewed order from doctor's order list CPM Left Knee CPM Left Knee: On Left Knee Flexion (Degrees): 90 Left Knee Extension (Degrees): 0 Additional Comments: trapeze bar patient helper   Hildred Priest 11/15/2015, 10:00 AM

## 2015-11-15 NOTE — Anesthesia Procedure Notes (Addendum)
Anesthesia Regional Block:  Adductor canal block  Pre-Anesthetic Checklist: ,, timeout performed, Correct Patient, Correct Site, Correct Laterality, Correct Procedure, Correct Position, site marked, Risks and benefits discussed,  Surgical consent,  Pre-op evaluation,  At surgeon's request and post-op pain management  Laterality: Left  Prep: chloraprep       Needles:  Injection technique: Single-shot  Needle Type: Echogenic Needle     Needle Length: 9cm 9 cm Needle Gauge: 21 and 21 G    Additional Needles:  Procedures: ultrasound guided (picture in chart) Adductor canal block Narrative:  Start time: 11/15/2015 7:07 AM End time: 11/15/2015 7:10 AM Injection made incrementally with aspirations every 5 mL.  Performed by: Personally  Anesthesiologist: Suella Broad D  Additional Notes: Pt tolerated well. Picture not saved in Ultrasound.    Spinal Patient location during procedure: OR Start time: 11/15/2015 7:28 AM End time: 11/15/2015 7:33 AM Staffing Anesthesiologist: Kassi Esteve Performed by: anesthesiologist  Preanesthetic Checklist Completed: patient identified, site marked, surgical consent, pre-op evaluation, timeout performed, IV checked, risks and benefits discussed and monitors and equipment checked Spinal Block Patient position: sitting Prep: DuraPrep Patient monitoring: heart rate, cardiac monitor, continuous pulse ox and blood pressure Approach: midline Location: L3-4 Injection technique: single-shot Needle Needle type: Pencan  Needle gauge: 24 G Needle length: 5 cm Needle insertion depth: 3 cm Assessment Sensory level: T6

## 2015-11-15 NOTE — Progress Notes (Signed)
RT set up CPAP for patient with home mask. Patient tolerating well at this time.

## 2015-11-15 NOTE — Transfer of Care (Signed)
Immediate Anesthesia Transfer of Care Note  Patient: Natalie Riddle  Procedure(s) Performed: Procedure(s): LEFT TOTAL KNEE ARTHROPLASTY (Left)  Patient Location: PACU  Anesthesia Type:Spinal  Level of Consciousness: awake, alert  and oriented  Airway & Oxygen Therapy: Patient Spontanous Breathing  Post-op Assessment: Report given to RN and Post -op Vital signs reviewed and stable  Post vital signs: Reviewed and stable  Last Vitals:  Filed Vitals:   11/03/15 1500 11/15/15 0548  BP: 156/72 140/58  Pulse: 93 93  Temp: 36.6 C 36.5 C  Resp:  20    Complications: No apparent anesthesia complications

## 2015-11-15 NOTE — Discharge Instructions (Signed)
Information on my medicine - ELIQUIS® (apixaban) ° °This medication education was reviewed with me or my healthcare representative as part of my discharge preparation.  The pharmacist that spoke with me during my hospital stay was:  Shalina Norfolk, Chalyn Ann, RPH ° °Why was Eliquis® prescribed for you? °Eliquis® was prescribed for you to reduce the risk of blood clots forming after orthopedic surgery.   ° °What do You need to know about Eliquis®? °Take your Eliquis® TWICE DAILY - one tablet in the morning and one tablet in the evening with or without food.  It would be best to take the dose about the same time each day. ° °If you have difficulty swallowing the tablet whole please discuss with your pharmacist how to take the medication safely. ° °Take Eliquis® exactly as prescribed by your doctor and DO NOT stop taking Eliquis® without talking to the doctor who prescribed the medication.  Stopping without other medication to take the place of Eliquis® may increase your risk of developing a clot. ° °After discharge, you should have regular check-up appointments with your healthcare provider that is prescribing your Eliquis®. ° °What do you do if you miss a dose? °If a dose of ELIQUIS® is not taken at the scheduled time, take it as soon as possible on the same day and twice-daily administration should be resumed.  The dose should not be doubled to make up for a missed dose.  Do not take more than one tablet of ELIQUIS at the same time. ° °Important Safety Information °A possible side effect of Eliquis® is bleeding. You should call your healthcare provider right away if you experience any of the following: °? Bleeding from an injury or your nose that does not stop. °? Unusual colored urine (red or dark brown) or unusual colored stools (red or black). °? Unusual bruising for unknown reasons. °? A serious fall or if you hit your head (even if there is no bleeding). ° °Some medicines may interact with Eliquis® and might  increase your risk of bleeding or clotting while on Eliquis®. To help avoid this, consult your healthcare provider or pharmacist prior to using any new prescription or non-prescription medications, including herbals, vitamins, non-steroidal anti-inflammatory drugs (NSAIDs) and supplements. ° °This website has more information on Eliquis® (apixaban): http://www.eliquis.com/eliquis/home °

## 2015-11-15 NOTE — Care Management Note (Signed)
Case Management Note  Patient Details  Name: BENTLEI WIATR MRN: [id number] Date of Birth: 1946/09/04  Subjective/Objective:   69 yr old female  S/p left total knee arthroplasty.                Action/Plan: Case manager spoke with this very pleasant lady at the bedside concerning home health and DME needs at discharge. Patient was preoperatively setup with [facility], no changes. Mrs. Lackman states she has a rolling walker at home, will not need a 3in1. The CPM has been delivered to her home by [employer]. Patient states her son is staying with her for 2 weeks and her daughter in law will be in this week so she will have family support. . Case manager will continue to monitor.  Expected Discharge Date:   11/17/15               Expected Discharge Plan:   Home with Home Health  In-House Referral:     Discharge planning Services  CM Consult  Post Acute Care Choice:  Home Health Choice offered to:  Patient  DME Arranged:  CPM DME Agency:  [employer]  HH Arranged:  PT HH Agency:  [facility]  Status of Service:  In process, will continue to follow  Medicare Important Message Given:    Date Medicare IM Given:    Medicare IM give by:    Date Additional Medicare IM Given:    Additional Medicare Important Message give by:     If discussed at [place] of Stay Meetings, dates discussed:    Additional Comments:  Ninfa Meeker, RN 11/15/2015, 4:23 PM

## 2015-11-15 NOTE — Evaluation (Signed)
Physical Therapy Evaluation Patient Details Name: Natalie Riddle MRN: [id number] DOB: February 15, 1946 Today's Date: 11/15/2015   History of Present Illness  69 y.o. female admitted to [facility] on 11/15/15 for elective L TKA.  Pt with significant PMHx of DM, HTN, urinary urgency, macular degeneration, asthmatic bronchitis, R TKA 07/2015.    Clinical Impression  Pt is POD #0 and moving well.  She is min guard assist overall with gait into the hallway.  I anticipate she will do well enough to d/c home with son's assist.   PT to follow acutely for deficits listed below.        Follow Up Recommendations Home health PT;Supervision for mobility/OOB    Equipment Recommendations  None recommended by PT    Recommendations for Other Services   NA    Precautions / Restrictions Precautions Precautions: Knee Precaution Booklet Issued: Yes (comment) Precaution Comments: knee handout given Required Braces or Orthoses: Knee Immobilizer - Left Knee Immobilizer - Left: Other (comment) (until d/c'd) Restrictions LLE Weight Bearing: Weight bearing as tolerated      Mobility  Bed Mobility Overal bed mobility: Modified Independent             General bed mobility comments: using railing  Transfers Overall transfer level: Needs assistance Equipment used: Rolling walker (2 wheeled) Transfers: Sit to/from Stand Sit to Stand: Supervision         General transfer comment: supervision with RW two person min hand held assist without RW  Ambulation/Gait Ambulation/Gait assistance: Min guard Ambulation Distance (Feet): 120 Feet Assistive device: Rolling walker (2 wheeled) Gait Pattern/deviations: Step-through pattern;Antalgic     General Gait Details: Pt with mildly antalgic gait pattern, good speed, and generally balanced.          Balance Overall balance assessment: Needs assistance Sitting-balance support: Feet supported;No upper extremity supported Sitting balance-Leahy Scale:  Good     Standing balance support: Bilateral upper extremity supported Standing balance-Leahy Scale: Fair                               Pertinent Vitals/Pain Pain Assessment: 0-10 Pain Score: 3  Pain Location: left knee Pain Descriptors / Indicators: Aching;Burning Pain Intervention(s): Limited activity within patient's tolerance;Monitored during session;Repositioned    Home Living Family/patient expects to be discharged to:: Private residence Living Arrangements: Alone Available Help at Discharge: Family;Available 24 hours/day (son here two weeks if needed) Type of Home: House Home Access: Stairs to enter Entrance Stairs-Rails: Right Entrance Stairs-Number of Steps: 4 to enter, 16 inside Home Layout: Multi-level Home Equipment: Walker - 2 wheels;Cane - single point;Tub bench;Bedside commode      Prior Function Level of Independence: Independent               Hand Dominance   Dominant Hand: Right    Extremity/Trunk Assessment   Upper Extremity Assessment: Defer to OT evaluation           Lower Extremity Assessment: LLE deficits/detail   LLE Deficits / Details: left leg with normal post op pain and weakness.  ankle at least 3/5, knee 3-/5, hip 3/5  Cervical / Trunk Assessment: Normal  Communication   Communication: No difficulties  Cognition Arousal/Alertness: Awake/alert Behavior During Therapy: WFL for tasks assessed/performed Overall Cognitive Status: Within Functional Limits for tasks assessed                         Exercises Total  Joint Exercises Ankle Circles/Pumps: AROM;Both;20 reps;Supine Quad Sets: AROM;Left;10 reps;Supine Towel Squeeze: AROM;Both;10 reps;Supine Heel Slides: AAROM;Left;10 reps;Supine Hip ABduction/ADduction: AROM;Left;10 reps;Supine Straight Leg Raises: AROM;Left;10 reps;Supine      Assessment/Plan    PT Assessment Patient needs continued PT services  PT Diagnosis Difficulty walking;Abnormality  of gait;Generalized weakness;Acute pain   PT Problem List Decreased strength;Decreased range of motion;Decreased activity tolerance;Decreased balance;Decreased mobility;Decreased knowledge of use of DME;Pain  PT Treatment Interventions DME instruction;Gait training;Stair training;Functional mobility training;Therapeutic activities;Therapeutic exercise;Balance training;Neuromuscular re-education;Patient/family education;Manual techniques;Modalities   PT Goals (Current goals can be found in the Care Plan section) Acute Rehab PT Goals Patient Stated Goal: to go home Wed PT Goal Formulation: With patient Time For Goal Achievement: 11/22/15 Potential to Achieve Goals: Good    Frequency 7X/week    End of Session Equipment Utilized During Treatment: Gait belt;Left knee immobilizer Activity Tolerance: Patient tolerated treatment well Patient left: in chair;with call bell/phone within reach           Time: 1639-1709 PT Time Calculation (min) (ACUTE ONLY): 30 min   Charges:   PT Evaluation $Initial PT Evaluation Tier I: 1 Procedure PT Treatments $Gait Training: 8-22 mins        Gavrielle Streck B. Ebb Carelock, PT, DPT [phone]   11/15/2015, 5:50 PM

## 2015-11-15 NOTE — Op Note (Signed)
MRN:     [id number] DOB/AGE:    1946/05/02 / 69 y.o.       OPERATIVE REPORT    DATE OF PROCEDURE:  11/15/2015       PREOPERATIVE DIAGNOSIS:   PRIMARY LOCALIZED OA LEFT KNEE      Estimated body mass index is 37.68 kg/(m^2) as calculated from the following:   Height as of this encounter: 5' 5.5" (1.664 m).   Weight as of this encounter: 104.327 kg (230 lb).                                                        POSTOPERATIVE DIAGNOSIS:   SAME                                                                      PROCEDURE:  Procedure(s): LEFT TOTAL KNEE ARTHROPLASTY Using Depuy Attune RP implants #6 narrow Femur, #5Tibia, 60mm attune RP bearing, 32 Patella     SURGEON: Evon Dejarnett A    ASSISTANT:  Kirstin Shepperson PA-C   (Present and scrubbed throughout the case, critical for assistance with exposure, retraction, instrumentation, and closure.)         ANESTHESIA: Spinal with Femoral Nerve Block  DRAINS: foley, 2 medium hemovac in knee   TOURNIQUET TIME: [id number]   COMPLICATIONS:  None     SPECIMENS: None   INDICATIONS FOR PROCEDURE: The patient has  DJD LEFT KNEE, varus deformities, XR shows bone on bone arthritis. Patient has failed all conservative measures including anti-inflammatory medicines, narcotics, attempts at  exercise and weight loss, cortisone injections and viscosupplementation.  Risks and benefits of surgery have been discussed, questions answered.   DESCRIPTION OF PROCEDURE: The patient identified by armband, received  right femoral nerve block and IV antibiotics, in the holding area at [facility]. Patient taken to the operating room, appropriate anesthetic  monitors were attached General endotracheal anesthesia induced with  the patient in supine position, Foley catheter was inserted. Tourniquet  applied high to the operative thigh. Lateral post and foot positioner  applied to the table, the lower extremity was then prepped and draped  in usual sterile  fashion from the ankle to the tourniquet. Time-out procedure was performed. The limb was wrapped with an Esmarch bandage and the tourniquet inflated to 365 mmHg. We began the operation by making the anterior midline incision starting at handbreadth above the patella going over the patella 1 cm medial to and  4 cm distal to the tibial tubercle. Small bleeders in the skin and the  subcutaneous tissue identified and cauterized. Transverse retinaculum was incised and reflected medially and a medial parapatellar arthrotomy was accomplished. the patella was everted and theprepatellar fat pad resected. The superficial medial collateral  ligament was then elevated from anterior to posterior along the proximal  flare of the tibia and anterior half of the menisci resected. The knee was hyperflexed exposing bone on bone arthritis. Peripheral and notch osteophytes as well as the cruciate ligaments were then resected. We continued to  work our way around posteriorly along the proximal tibia,  and externally  rotated the tibia subluxing it out from underneath the femur. A McHale  retractor was placed through the notch and a lateral Hohmann retractor  placed, and we then drilled through the proximal tibia in line with the  axis of the tibia followed by an intramedullary guide rod and 2-degree  posterior slope cutting guide. The tibial cutting guide was pinned into place  allowing resection of 4 mm of bone medially and about 6 mm of bone  laterally because of her varus deformity. Satisfied with the tibial resection, we then  entered the distal femur 2 mm anterior to the PCL origin with the  intramedullary guide rod and applied the distal femoral cutting guide  set at 60mm, with 5 degrees of valgus. This was pinned along the  epicondylar axis. At this point, the distal femoral cut was accomplished without difficulty. We then sized for a #6 narrow femoral component and pinned the guide in 3 degrees of external  rotation.The chamfer cutting guide was pinned into place. The anterior, posterior, and chamfer cuts were accomplished without difficulty followed by  the atune RP box cutting guide and the box cut. We also removed posterior osteophytes from the posterior femoral condyles. At this  time, the knee was brought into full extension. We checked our  extension and flexion gaps and found them symmetric at 22mm.  The patella thickness measured at 21 mm. We set the cutting guide at 13 and removed the posterior 8 mm  of the patella sized for 32 button and drilled the lollipop. The knee  was then once again hyperflexed exposing the proximal tibia. We sized for a #5 tibial base plate, applied the smokestack and the conical reamer followed by the the Delta fin keel punch. We then hammered into place the attune RP trial femoral component, inserted a 5-mm trial bearing, trial patellar button, and took the knee through range of motion from 0-130 degrees. No thumb pressure was required for patellar  tracking. At this point, all trial components were removed, a double batch of DePuy HV cement with 1500 mg of Zinacef was mixed and applied to all bony metallic mating surfaces except for the posterior condyles of the femur itself. In order, we  hammered into place the tibial tray and removed excess cement, the femoral component and removed excess cement, a 5-mm attune RP bearing  was inserted, and the knee brought to full extension with compression.  The patellar button was clamped into place, and excess cement  removed. While the cement cured the wound was irrigated out with normal saline solution pulse lavage, and medium Hemovac drains were placed.. Ligament stability and patellar tracking were checked and found to be excellent. The tourniquet was then released and hemostasis was obtained with cautery. The parapatellar arthrotomy was closed with  #1 ethibond suture. The subcutaneous tissue with 0 and 2-0 undyed  Vicryl  suture, and 4-0 Monocryl.. A dressing of Xeroform,  4 x 4, dressing sponges, Webril, and Ace wrap applied. Needle and sponge count were correct times 2.The patient awakened, extubated, and taken to recovery room without difficulty. Vascular status was normal, pulses 2+ and symmetric.   Natalie Riddle A 11/15/2015, 8:56 AM

## 2015-11-15 NOTE — Anesthesia Preprocedure Evaluation (Addendum)
Anesthesia Evaluation  Patient identified by MRN, date of birth, ID band Patient awake    Reviewed: Allergy & Precautions, NPO status , Patient's Chart, lab work & pertinent test results  Airway Mallampati: II  TM Distance: >3 FB Neck ROM: Full    Dental  (+) Teeth Intact   Pulmonary asthma , former smoker,    breath sounds clear to auscultation       Cardiovascular hypertension, Pt. on medications  Rhythm:Regular Rate:Normal     Neuro/Psych negative neurological ROS  negative psych ROS   GI/Hepatic negative GI ROS, Neg liver ROS,   Endo/Other  diabetes, Type 2, Oral Hypoglycemic Agents  Renal/GU negative Renal ROS  negative genitourinary   Musculoskeletal  (+) Arthritis ,   Abdominal   Peds negative pediatric ROS (+)  Hematology negative hematology ROS (+)   Anesthesia Other Findings   Reproductive/Obstetrics                            Lab Results  Component Value Date   WBC 12.3* 11/05/2015   HGB 11.8* 11/05/2015   HCT 36.2 11/05/2015   MCV 93.3 11/05/2015   PLT 237 11/05/2015   Lab Results  Component Value Date   CREATININE 1.00 11/05/2015   BUN 27* 11/05/2015   NA 139 11/05/2015   K 3.6 11/05/2015   CL 108 11/05/2015   CO2 21* 11/05/2015   Lab Results  Component Value Date   INR 1.17 11/05/2015   INR 1.10 07/22/2015   EKG: normal sinus rhythm.    Anesthesia Physical Anesthesia Plan  ASA: III  Anesthesia Plan: Spinal   Post-op Pain Management:    Induction: Intravenous  Airway Management Planned: Natural Airway and Simple Face Mask  Additional Equipment:   Intra-op Plan:   Post-operative Plan:   Informed Consent: I have reviewed the patients History and Physical, chart, labs and discussed the procedure including the risks, benefits and alternatives for the proposed anesthesia with the patient or authorized representative who has indicated his/her  understanding and acceptance.   Dental advisory given  Plan Discussed with: CRNA  Anesthesia Plan Comments:         Anesthesia Quick Evaluation

## 2015-11-15 NOTE — Anesthesia Postprocedure Evaluation (Signed)
Anesthesia Post Note  Patient: Natalie Riddle  Procedure(s) Performed: Procedure(s) (LRB): LEFT TOTAL KNEE ARTHROPLASTY (Left)  Patient location during evaluation: PACU Anesthesia Type: Spinal and Regional Level of consciousness: awake and alert and oriented Pain management: pain level controlled Vital Signs Assessment: post-procedure vital signs reviewed and stable Cardiovascular status: stable Postop Assessment: Spinal receding Anesthetic complications: no    Last Vitals:  Filed Vitals:   11/15/15 1017 11/15/15 1041  BP:  133/64  Pulse: 87 88  Temp: 36.4 C 36.7 C  Resp: 25 20    Last Pain:  Filed Vitals:   11/15/15 1041  PainSc: 2       LLE Sensation: Decreased   RLE Sensation: Decreased L Sensory Level: L5-Outer lower leg, top of foot, great toe R Sensory Level: L5-Outer lower leg, top of foot, great toe  Effie Berkshire

## 2015-11-16 ENCOUNTER — Encounter (HOSPITAL_COMMUNITY): Payer: Self-pay | Admitting: Orthopedic Surgery

## 2015-11-16 LAB — BASIC METABOLIC PANEL
ANION GAP: 8 (ref 5–15)
BUN: 22 mg/dL — AB (ref 6–20)
CALCIUM: 9 mg/dL (ref 8.9–10.3)
CO2: 22 mmol/L (ref 22–32)
CREATININE: 0.95 mg/dL (ref 0.44–1.00)
Chloride: 110 mmol/L (ref 101–111)
GFR calc Af Amer: >60 mL/min (ref >60)
GFR, EST NON AFRICAN AMERICAN: 59 mL/min — AB (ref >60)
GLUCOSE: 189 mg/dL — AB (ref 65–99)
Potassium: 4.7 mmol/L (ref 3.5–5.1)
Sodium: 140 mmol/L (ref 135–145)

## 2015-11-16 LAB — CBC
HCT: 31.8 % — ABNORMAL LOW (ref 36.0–46.0)
Hemoglobin: 10.3 g/dL — ABNORMAL LOW (ref 12.0–15.0)
MCH: 30.6 pg (ref 26.0–34.0)
MCHC: 32.4 g/dL (ref 30.0–36.0)
MCV: 94.4 fL (ref 78.0–100.0)
PLATELETS: 154 10*3/uL (ref 150–400)
RBC: 3.37 MIL/uL — ABNORMAL LOW (ref 3.87–5.11)
RDW: 13.8 % (ref 11.5–15.5)
WBC: 12.3 10*3/uL — AB (ref 4.0–10.5)

## 2015-11-16 LAB — GLUCOSE, CAPILLARY: Glucose-Capillary: 182 mg/dL — ABNORMAL HIGH (ref 65–99)

## 2015-11-16 MED ORDER — APIXABAN 2.5 MG PO TABS: 2.5000 mg | ORAL_TABLET | Freq: Two times a day (BID) | ORAL | Status: DC

## 2015-11-16 MED ORDER — OXYCODONE HCL 5 MG PO TABS: ORAL_TABLET | ORAL | Status: DC

## 2015-11-16 NOTE — Progress Notes (Addendum)
Physical Therapy Treatment Patient Details Name: Natalie Riddle MRN: [id number] DOB: [birth year] Today's Date: 11/16/2015    History of Present Illness 69 y.o. female admitted to [facility] on 11/15/15 for elective L TKA.  Pt with significant PMHx of DM, HTN, urinary urgency, macular degeneration, asthmatic bronchitis, R TKA 07/2015.      PT Comments    The pt is doing very well this morning and should progress very quickly with recovery.  She was able to walk in hallway and reported only minimal pain and needed no rest breaks.  Pt also able to ascend/descend stairs to practice for return to home.  Pt very understanding of HEP exercises and the importance of therapy as she has already had a R TKA.  4/5 goals met.  Plan for D/C this afternoon.     Follow Up Recommendations  Home health PT;Supervision for mobility/OOB     Equipment Recommendations  None recommended by PT       Precautions / Restrictions Precautions Precautions: Knee Restrictions Weight Bearing Restrictions: Yes LLE Weight Bearing: Weight bearing as tolerated    Mobility  Bed Mobility               General bed mobility comments: Pt. in recliner, returned to recliner at end of session  Transfers Overall transfer level: Modified independent Equipment used: Straight cane Transfers: Sit to/from Stand Sit to Stand: Modified independent (Device/Increase time)         General transfer comment: Pt. able to stand with cane in R hand and required no cues for hand placement.  Pt. rises quickly but is able to steady herself with standing.    Ambulation/Gait Ambulation/Gait assistance: Supervision;Min guard Ambulation Distance (Feet): 600 Feet Assistive device: Straight cane Gait Pattern/deviations: Step-through pattern   Gait velocity interpretation: at or above normal speed for age/gender General Gait Details: Pt. walks quickly and relies on straight cane in R hand for balance and control.  Pt. needed min  guard assist at beginning of walking but progressed to supervision for safety by the end of ambulation.      Stairs Stairs: Yes Stairs assistance: Min guard Stair Management: One rail Right;One rail Left;Step to pattern;Forwards;With cane Number of Stairs: 12 General stair comments: Pt. uses cane in L to ascend and in R to descend.  Pt. is able to control her ascent/descent and required only one verbal cue for correct sequence.  Min guard for safety.      Balance Overall balance assessment: Needs assistance Sitting-balance support: Feet supported Sitting balance-Leahy Scale: Good     Standing balance support: Single extremity supported Standing balance-Leahy Scale: Good                      Cognition Arousal/Alertness: Awake/alert Behavior During Therapy: WFL for tasks assessed/performed Overall Cognitive Status: Within Functional Limits for tasks assessed                      Exercises Total Joint Exercises Long Arc Quad: AROM;Left;10 reps;Seated;Other (comment) (5 sec hold) Knee Flexion: AROM;15 reps;Seated;Left Goniometric ROM: 0-86 (long sitting in recliner - AROM)        Pertinent Vitals/Pain Pain Assessment: 0-10 Pain Score: 1  (4 with activity) Pain Location: L knee Pain Descriptors / Indicators: Aching Pain Intervention(s): Limited activity within patient's tolerance;Monitored during session;Repositioned           PT Goals (current goals can now be found in the care plan section) Acute Rehab  PT Goals Patient Stated Goal: To walk like a normal person  Progress towards PT goals: Progressing toward goals    Frequency  7X/week    PT Plan Current plan remains appropriate       End of Session Equipment Utilized During Treatment: Gait belt Activity Tolerance: Patient tolerated treatment well Patient left: in chair;with call bell/phone within reach;with family/visitor present     Time: 0211-1735 PT Time Calculation (min) (ACUTE ONLY):  25 min  Charges:  $Gait Training: [place], [place] - Office   11/16/2015, 3:13 PM

## 2015-11-16 NOTE — Progress Notes (Signed)
PT Cancellation Note  Patient Details Name: Natalie Riddle MRN: [id number] DOB: August 29, 1946   Cancelled Treatment:    Reason Eval/Treat Not Completed: Patient declined, no reason specified  Pt preparing for D/C.  Reported that she feels comfortable with HEP and had no questions for therapist.      Damaris Hippo  [phone] - Office  11/16/2015, 3:11 PM

## 2015-11-16 NOTE — Progress Notes (Signed)
OT Cancellation Note  Patient Details Name: RIHANNA STRINGFIELD MRN: [id number] DOB: 1946-11-05   Cancelled Treatment:    Reason Eval/Treat Not Completed: OT screened, no needs identified, will sign off. Per discussion with pt and chart review no OT needs identified. Pt with recent knee surgery in August and with good recall of ADL techniques, has needed equipment from OT perspective.   Hortencia Pilar 11/16/2015, 9:29 AM

## 2015-11-17 DIAGNOSIS — Z471 Aftercare following joint replacement surgery: Secondary | ICD-10-CM | POA: Diagnosis not present

## 2015-11-17 DIAGNOSIS — I10 Essential (primary) hypertension: Secondary | ICD-10-CM | POA: Diagnosis not present

## 2015-11-17 DIAGNOSIS — H353190 Nonexudative age-related macular degeneration, unspecified eye, stage unspecified: Secondary | ICD-10-CM | POA: Diagnosis not present

## 2015-11-17 DIAGNOSIS — Z96652 Presence of left artificial knee joint: Secondary | ICD-10-CM | POA: Diagnosis not present

## 2015-11-17 DIAGNOSIS — E119 Type 2 diabetes mellitus without complications: Secondary | ICD-10-CM | POA: Diagnosis not present

## 2015-11-17 DIAGNOSIS — J45909 Unspecified asthma, uncomplicated: Secondary | ICD-10-CM | POA: Diagnosis not present

## 2015-11-19 DIAGNOSIS — J45909 Unspecified asthma, uncomplicated: Secondary | ICD-10-CM | POA: Diagnosis not present

## 2015-11-19 DIAGNOSIS — Z96652 Presence of left artificial knee joint: Secondary | ICD-10-CM | POA: Diagnosis not present

## 2015-11-19 DIAGNOSIS — Z471 Aftercare following joint replacement surgery: Secondary | ICD-10-CM | POA: Diagnosis not present

## 2015-11-19 DIAGNOSIS — E119 Type 2 diabetes mellitus without complications: Secondary | ICD-10-CM | POA: Diagnosis not present

## 2015-11-19 DIAGNOSIS — I10 Essential (primary) hypertension: Secondary | ICD-10-CM | POA: Diagnosis not present

## 2015-11-19 DIAGNOSIS — H353190 Nonexudative age-related macular degeneration, unspecified eye, stage unspecified: Secondary | ICD-10-CM | POA: Diagnosis not present

## 2015-11-22 DIAGNOSIS — Z471 Aftercare following joint replacement surgery: Secondary | ICD-10-CM | POA: Diagnosis not present

## 2015-11-22 DIAGNOSIS — Z96652 Presence of left artificial knee joint: Secondary | ICD-10-CM | POA: Diagnosis not present

## 2015-11-22 DIAGNOSIS — I10 Essential (primary) hypertension: Secondary | ICD-10-CM | POA: Diagnosis not present

## 2015-11-22 DIAGNOSIS — H353190 Nonexudative age-related macular degeneration, unspecified eye, stage unspecified: Secondary | ICD-10-CM | POA: Diagnosis not present

## 2015-11-22 DIAGNOSIS — J45909 Unspecified asthma, uncomplicated: Secondary | ICD-10-CM | POA: Diagnosis not present

## 2015-11-22 DIAGNOSIS — E119 Type 2 diabetes mellitus without complications: Secondary | ICD-10-CM | POA: Diagnosis not present

## 2015-11-23 NOTE — Discharge Summary (Signed)
Patient ID: Natalie Riddle MRN: [id number] DOB/AGE: Oct 01, 1946 69 y.o.  Admit date: 11/15/2015 Discharge date: 11/16/2015  Admission Diagnoses:  Principal Problem:   Primary localized osteoarthritis of left knee Active Problems:   Diabetes mellitus without complication (HCC)   Hypertension   Hyperlipidemia   Obesity   Sleep apnea   Primary localized osteoarthritis of right knee   DJD (degenerative joint disease) of knee   Discharge Diagnoses:  Same  Past Medical History  Diagnosis Date  . Hyperlipidemia     takes Atorvastatin daily  . Cancer ([place])   . Primary localized osteoarthritis of right knee   . Diabetes mellitus without complication ([place])     takes Metformin daily  . Hypertension     takes Benicar daily  . Glaucoma     uses eye drops daily and takes Diamox daily  . Joint pain   . Joint swelling   . History of gastric ulcer   . History of colon polyps     benign  . Diverticulosis   . Urinary urgency   . Sleep apnea     uses CPAP  . Macular degeneration     dry  . Asthmatic bronchitis     Surgeries: Procedure(s): LEFT TOTAL KNEE ARTHROPLASTY on 11/15/2015   Consultants:    Discharged Condition: Improved  Hospital Course: Natalie Riddle is an 69 y.o. female who was admitted 11/15/2015 for operative treatment ofPrimary localized osteoarthritis of left knee. Patient has severe unremitting pain that affects sleep, daily activities, and work/hobbies. After pre-op clearance the patient was taken to the operating room on 11/15/2015 and underwent  Procedure(s): LEFT TOTAL KNEE ARTHROPLASTY.    Patient was given perioperative antibiotics:  Anti-infectives    Start     Dose/Rate Route Frequency Ordered Stop   11/15/15 1400  ceFAZolin (ANCEF) IVPB 2 g/50 mL premix     2 g 100 mL/hr over 30 Minutes Intravenous Every 6 hours 11/15/15 1045 11/15/15 2202   11/15/15 0843  cefUROXime (ZINACEF) injection  Status:  Discontinued       As needed  11/15/15 0843 11/15/15 0924   11/15/15 0700  ceFAZolin (ANCEF) IVPB 2 g/50 mL premix     2 g 100 mL/hr over 30 Minutes Intravenous To [facility] 11/12/15 1432 11/15/15 0730       Patient was given sequential compression devices, early ambulation, and chemoprophylaxis to prevent DVT.  Patient benefited maximally from hospital stay and there were no complications.    Recent vital signs: No data found.    Recent laboratory studies: No results for input(s): WBC, HGB, HCT, PLT, NA, K, CL, CO2, BUN, CREATININE, GLUCOSE, INR, CALCIUM in the last 72 hours.  Invalid input(s): PT, 2   Discharge Medications:     Medication List    STOP taking these medications        aspirin EC 81 MG tablet     predniSONE 10 MG tablet  Commonly known as:  DELTASONE      TAKE these medications        acetaZOLAMIDE 500 MG capsule  Commonly known as:  DIAMOX  Take 500 mg by mouth daily.     apixaban 2.5 MG Tabs tablet  Commonly known as:  ELIQUIS  Take 1 tablet (2.5 mg total) by mouth every 12 (twelve) hours.     atorvastatin 20 MG tablet  Commonly known as:  LIPITOR  Take 20 mg by mouth daily.     celecoxib 200 MG capsule  Commonly known as:  CELEBREX  Take 200 mg by mouth daily.     cycloSPORINE 0.05 % ophthalmic emulsion  Commonly known as:  RESTASIS  Place 1 drop into both eyes 2 (two) times daily.     dextromethorphan-guaiFENesin 30-600 MG 12hr tablet  Commonly known as:  MUCINEX DM  Take 1 tablet by mouth 2 (two) times daily.     docusate sodium 100 MG capsule  Commonly known as:  COLACE  1 tab 2 times a day while on narcotics.  STOOL SOFTENER     GENTEAL PM OP  Apply 1 drop to eye at bedtime. Preservative Free     LIQUID TEARS OP  Apply 1 drop to eye daily as needed (dry eye).     metFORMIN 500 MG tablet  Commonly known as:  GLUCOPHAGE  Take 500 mg by mouth 2 (two) times daily with a meal.     olmesartan-hydrochlorothiazide 40-25 MG tablet  Commonly known as:   BENICAR HCT  Take 1 tablet by mouth daily.     oxyCODONE 5 MG immediate release tablet  Commonly known as:  Oxy IR/ROXICODONE  1-2 tablets every 4-6 hrs as needed for pain     polyethylene glycol packet  Commonly known as:  MIRALAX / GLYCOLAX  17grams in 16 oz of water twice a day until bowel movement.  LAXITIVE.  Restart if two days since last bowel movement     PRESERVISION AREDS 2 PO  Take 1 tablet by mouth daily.     Vitamin D 2000 UNITS tablet  Take 2,000 Units by mouth daily.        Diagnostic Studies: No results found.  Disposition: 01-Home or Self Care      Discharge Instructions    CPM    Complete by:  As directed   Continuous passive motion machine (CPM):      Use the CPM from 0 to 90 for 6 hours per day.       You may break it up into 2 or 3 sessions per day.      Use CPM for 2 weeks or until you are told to stop.     Call MD / Call 911    Complete by:  As directed   If you experience chest pain or shortness of breath, CALL 911 and be transported to the hospital emergency room.  If you develope a fever above 101 F, pus (white drainage) or increased drainage or redness at the wound, or calf pain, call your surgeon's office.     Change dressing    Complete by:  As directed   Change the gauze dressing daily with sterile 4 x 4 inch gauze and apply TED hose.  DO NOT REMOVE BANDAGE OVER SURGICAL INCISION.  [place] WHOLE LEG INCLUDING OVER THE WATERPROOF BANDAGE WITH SOAP AND WATER EVERY DAY.     Constipation Prevention    Complete by:  As directed   Drink plenty of fluids.  Prune juice may be helpful.  You may use a stool softener, such as Colace (over the counter) 100 mg twice a day.  Use MiraLax (over the counter) for constipation as needed.     Diet - low sodium heart healthy    Complete by:  As directed      Discharge instructions    Complete by:  As directed   INSTRUCTIONS AFTER JOINT REPLACEMENT   Remove items at home which could result in a fall. This includes  throw rugs  or furniture in walking pathways ICE to the affected joint every three hours while awake for 30 minutes at a time, for at least the first 3-5 days, and then as needed for pain and swelling.  Continue to use ice for pain and swelling. You may notice swelling that will progress down to the foot and ankle.  This is normal after surgery.  Elevate your leg when you are not up walking on it.   Continue to use the breathing machine you got in the hospital (incentive spirometer) which will help keep your temperature down.  It is common for your temperature to cycle up and down following surgery, especially at night when you are not up moving around and exerting yourself.  The breathing machine keeps your lungs expanded and your temperature down.   DIET:  As you were doing prior to hospitalization, we recommend a well-balanced diet.  DRESSING / WOUND CARE / SHOWERING  Keep the surgical dressing until follow up.  The dressing is water proof, so you can shower without any extra covering.  IF THE DRESSING FALLS OFF or the wound gets wet inside, change the dressing with sterile gauze.  Please use good hand washing techniques before changing the dressing.  Do not use any lotions or creams on the incision until instructed by your surgeon.    ACTIVITY  Increase activity slowly as tolerated, but follow the weight bearing instructions below.   No driving for 6 weeks or until further direction given by your physician.  You cannot drive while taking narcotics.  No lifting or carrying greater than 10 lbs. until further directed by your surgeon. Avoid periods of inactivity such as sitting longer than an hour when not asleep. This helps prevent blood clots.  You may return to work once you are authorized by your doctor.     WEIGHT BEARING   Weight bearing as tolerated with assist device (walker, cane, etc) as directed, use it as long as suggested by your surgeon or therapist, typically at least 2-3  weeks.   EXERCISES  Results after joint replacement surgery are often greatly improved when you follow the exercise, range of motion and muscle strengthening exercises prescribed by your doctor. Safety measures are also important to protect the joint from further injury. Any time any of these exercises cause you to have increased pain or swelling, decrease what you are doing until you are comfortable again and then slowly increase them. If you have problems or questions, call your caregiver or physical therapist for advice.   Rehabilitation is important following a joint replacement. After just a few days of immobilization, the muscles of the leg can become weakened and shrink (atrophy).  These exercises are designed to build up the tone and strength of the thigh and leg muscles and to improve motion. Often times heat used for twenty to thirty minutes before working out will loosen up your tissues and help with improving the range of motion but do not use heat for the first two weeks following surgery (sometimes heat can increase post-operative swelling).   These exercises can be done on a training (exercise) mat, on the floor, on a table or on a bed. Use whatever works the best and is most comfortable for you.    Use music or television while you are exercising so that the exercises are a pleasant break in your day. This will make your life better with the exercises acting as a break in your routine that you can  look forward to.   Perform all exercises about fifteen times, three times per day or as directed.  You should exercise both the operative leg and the other leg as well.   Exercises include:   Quad Sets - Tighten up the muscle on the front of the thigh (Quad) and hold for 5-10 seconds.   Straight Leg Raises - With your knee straight (if you were given a brace, keep it on), lift the leg to 60 degrees, hold for 3 seconds, and slowly lower the leg.  Perform this exercise against resistance later  as your leg gets stronger.  Leg Slides: Lying on your back, slowly slide your foot toward your buttocks, bending your knee up off the floor (only go as far as is comfortable). Then slowly slide your foot back down until your leg is flat on the floor again.  Angel Wings: Lying on your back spread your legs to the side as far apart as you can without causing discomfort.  Hamstring Strength:  Lying on your back, push your heel against the floor with your leg straight by tightening up the muscles of your buttocks.  Repeat, but this time bend your knee to a comfortable angle, and push your heel against the floor.  You may put a pillow under the heel to make it more comfortable if necessary.   A rehabilitation program following joint replacement surgery can speed recovery and prevent re-injury in the future due to weakened muscles. Contact your doctor or a physical therapist for more information on knee rehabilitation.    CONSTIPATION  Constipation is defined medically as fewer than three stools per week and severe constipation as less than one stool per week.  Even if you have a regular bowel pattern at home, your normal regimen is likely to be disrupted due to multiple reasons following surgery.  Combination of anesthesia, postoperative narcotics, change in appetite and fluid intake all can affect your bowels.   YOU MUST use at least one of the following options; they are listed in order of increasing strength to get the job done.  They are all available over the counter, and you may need to use some, POSSIBLY even all of these options:    Drink plenty of fluids (prune juice may be helpful) and high fiber foods Colace 100 mg by mouth twice a day  Senokot for constipation as directed and as needed Dulcolax (bisacodyl), take with full glass of water  Miralax (polyethylene glycol) once or twice a day as needed.  If you have tried all these things and are unable to have a bowel movement in the first 3-4  days after surgery call either your surgeon or your primary doctor.    If you experience loose stools or diarrhea, hold the medications until you stool forms back up.  If your symptoms do not get better within 1 week or if they get worse, check with your doctor.  If you experience "the worst abdominal pain ever" or develop nausea or vomiting, please contact the office immediately for further recommendations for treatment.   ITCHING:  If you experience itching with your medications, try taking only a single pain pill, or even half a pain pill at a time.  You can also use Benadryl over the counter for itching or also to help with sleep.   TED HOSE STOCKINGS:  Use stockings on both legs until for at least 2 weeks or as directed by physician office. They may be removed at night  for sleeping.  MEDICATIONS:  See your medication summary on the "After Visit Summary" that nursing will review with you.  You may have some home medications which will be placed on hold until you complete the course of blood thinner medication.  It is important for you to complete the blood thinner medication as prescribed.  PRECAUTIONS:  If you experience chest pain or shortness of breath - call 911 immediately for transfer to the hospital emergency department.   If you develop a fever greater that 101 F, purulent drainage from wound, increased redness or drainage from wound, foul odor from the wound/dressing, or calf pain - CONTACT YOUR SURGEON.                                                   FOLLOW-UP APPOINTMENTS:  If you do not already have a post-op appointment, please call the office for an appointment to be seen by your surgeon.  Guidelines for how soon to be seen are listed in your "After Visit Summary", but are typically between 1-4 weeks after surgery.  OTHER INSTRUCTIONS:   Knee Replacement:  Do not place pillow under knee, focus on keeping the knee straight while resting. CPM instructions: 0-90 degrees, 2 hours  in the morning, 2 hours in the afternoon, and 2 hours in the evening. Place foam block, curve side up under heel at all times except when in CPM or when walking.  DO NOT modify, tear, cut, or change the foam block in any way.  MAKE SURE YOU:  Understand these instructions.  Get help right away if you are not doing well or get worse.    Thank you for letting us be a part of your medical care team.  It is a privilege we respect greatly.  We hope these instructions will help you stay on track for a fast and full recovery!     Do not put a pillow under the knee. Place it under the heel.    Complete by:  As directed   Place gray foam block, curve side up under heel at all times except when in CPM or when walking.  DO NOT modify, tear, cut, or change in any way the gray foam block.     Increase activity slowly as tolerated    Complete by:  As directed      TED hose    Complete by:  As directed   Use stockings (TED hose) for 2 weeks on both leg(s).  You may remove them at night for sleeping.           Follow-up Information    Follow up with [facility].   Why:  Someone from [facility] will contact you concerning start date and time for therapy,.   Contact information:   [address] [place] [address] [phone]       Follow up with Lorn Junes, MD On 11/29/2015.   Specialty:  Orthopedic Surgery   Why:  appt time 2:30pm   Contact information:   [place] [address] [phone]        Signed: Linda Hedges 11/23/2015, 11:11 AM

## 2015-11-24 DIAGNOSIS — J45909 Unspecified asthma, uncomplicated: Secondary | ICD-10-CM | POA: Diagnosis not present

## 2015-11-24 DIAGNOSIS — Z471 Aftercare following joint replacement surgery: Secondary | ICD-10-CM | POA: Diagnosis not present

## 2015-11-24 DIAGNOSIS — I10 Essential (primary) hypertension: Secondary | ICD-10-CM | POA: Diagnosis not present

## 2015-11-24 DIAGNOSIS — Z96652 Presence of left artificial knee joint: Secondary | ICD-10-CM | POA: Diagnosis not present

## 2015-11-24 DIAGNOSIS — H353190 Nonexudative age-related macular degeneration, unspecified eye, stage unspecified: Secondary | ICD-10-CM | POA: Diagnosis not present

## 2015-11-24 DIAGNOSIS — E119 Type 2 diabetes mellitus without complications: Secondary | ICD-10-CM | POA: Diagnosis not present

## 2015-11-26 DIAGNOSIS — Z96652 Presence of left artificial knee joint: Secondary | ICD-10-CM | POA: Diagnosis not present

## 2015-11-26 DIAGNOSIS — J45909 Unspecified asthma, uncomplicated: Secondary | ICD-10-CM | POA: Diagnosis not present

## 2015-11-26 DIAGNOSIS — I10 Essential (primary) hypertension: Secondary | ICD-10-CM | POA: Diagnosis not present

## 2015-11-26 DIAGNOSIS — H353190 Nonexudative age-related macular degeneration, unspecified eye, stage unspecified: Secondary | ICD-10-CM | POA: Diagnosis not present

## 2015-11-26 DIAGNOSIS — E119 Type 2 diabetes mellitus without complications: Secondary | ICD-10-CM | POA: Diagnosis not present

## 2015-11-26 DIAGNOSIS — Z471 Aftercare following joint replacement surgery: Secondary | ICD-10-CM | POA: Diagnosis not present

## 2015-11-29 DIAGNOSIS — Z96651 Presence of right artificial knee joint: Secondary | ICD-10-CM | POA: Diagnosis not present

## 2015-11-29 DIAGNOSIS — M25562 Pain in left knee: Secondary | ICD-10-CM | POA: Diagnosis not present

## 2015-11-29 DIAGNOSIS — M25662 Stiffness of left knee, not elsewhere classified: Secondary | ICD-10-CM | POA: Diagnosis not present

## 2015-11-29 DIAGNOSIS — R262 Difficulty in walking, not elsewhere classified: Secondary | ICD-10-CM | POA: Diagnosis not present

## 2015-11-29 DIAGNOSIS — R531 Weakness: Secondary | ICD-10-CM | POA: Diagnosis not present

## 2015-11-30 ENCOUNTER — Ambulatory Visit (HOSPITAL_COMMUNITY)
Admission: RE | Admit: 2015-11-30 | Discharge: 2015-11-30 | Disposition: A | Discharge status: Home / Self Care | Payer: Medicare Other | Source: Ambulatory Visit | Attending: Cardiology | Admitting: Cardiology

## 2015-11-30 ENCOUNTER — Other Ambulatory Visit (HOSPITAL_COMMUNITY): Payer: Self-pay | Admitting: Orthopedic Surgery

## 2015-11-30 DIAGNOSIS — E119 Type 2 diabetes mellitus without complications: Secondary | ICD-10-CM | POA: Diagnosis not present

## 2015-11-30 DIAGNOSIS — M7989 Other specified soft tissue disorders: Secondary | ICD-10-CM | POA: Diagnosis not present

## 2015-11-30 DIAGNOSIS — E785 Hyperlipidemia, unspecified: Secondary | ICD-10-CM | POA: Insufficient documentation

## 2015-11-30 DIAGNOSIS — I10 Essential (primary) hypertension: Secondary | ICD-10-CM | POA: Diagnosis not present

## 2015-11-30 DIAGNOSIS — M79605 Pain in left leg: Secondary | ICD-10-CM

## 2015-12-06 DIAGNOSIS — Z79899 Other long term (current) drug therapy: Secondary | ICD-10-CM | POA: Diagnosis not present

## 2015-12-06 DIAGNOSIS — R112 Nausea with vomiting, unspecified: Secondary | ICD-10-CM | POA: Diagnosis not present

## 2015-12-06 DIAGNOSIS — I10 Essential (primary) hypertension: Secondary | ICD-10-CM | POA: Diagnosis not present

## 2015-12-07 ENCOUNTER — Encounter (HOSPITAL_COMMUNITY): Payer: Self-pay | Admitting: General Practice

## 2015-12-07 ENCOUNTER — Encounter: Payer: Self-pay | Admitting: Physician Assistant

## 2015-12-07 ENCOUNTER — Observation Stay (HOSPITAL_COMMUNITY)
Admission: AD | Admit: 2015-12-07 | Discharge: 2015-12-08 | Disposition: A | Discharge status: Home / Self Care | Payer: Medicare Other | Source: Ambulatory Visit | Attending: Orthopedic Surgery | Admitting: Orthopedic Surgery

## 2015-12-07 ENCOUNTER — Other Ambulatory Visit: Payer: Self-pay | Admitting: Physician Assistant

## 2015-12-07 DIAGNOSIS — H409 Unspecified glaucoma: Secondary | ICD-10-CM | POA: Diagnosis not present

## 2015-12-07 DIAGNOSIS — I959 Hypotension, unspecified: Principal | ICD-10-CM | POA: Insufficient documentation

## 2015-12-07 DIAGNOSIS — E119 Type 2 diabetes mellitus without complications: Secondary | ICD-10-CM | POA: Insufficient documentation

## 2015-12-07 DIAGNOSIS — R42 Dizziness and giddiness: Secondary | ICD-10-CM | POA: Diagnosis not present

## 2015-12-07 DIAGNOSIS — Z86010 Personal history of colonic polyps: Secondary | ICD-10-CM | POA: Insufficient documentation

## 2015-12-07 DIAGNOSIS — R112 Nausea with vomiting, unspecified: Secondary | ICD-10-CM | POA: Insufficient documentation

## 2015-12-07 DIAGNOSIS — E860 Dehydration: Secondary | ICD-10-CM | POA: Diagnosis present

## 2015-12-07 DIAGNOSIS — J45909 Unspecified asthma, uncomplicated: Secondary | ICD-10-CM | POA: Insufficient documentation

## 2015-12-07 DIAGNOSIS — R2240 Localized swelling, mass and lump, unspecified lower limb: Secondary | ICD-10-CM | POA: Insufficient documentation

## 2015-12-07 DIAGNOSIS — I10 Essential (primary) hypertension: Secondary | ICD-10-CM | POA: Insufficient documentation

## 2015-12-07 DIAGNOSIS — M1711 Unilateral primary osteoarthritis, right knee: Secondary | ICD-10-CM | POA: Diagnosis not present

## 2015-12-07 DIAGNOSIS — K5790 Diverticulosis of intestine, part unspecified, without perforation or abscess without bleeding: Secondary | ICD-10-CM | POA: Diagnosis not present

## 2015-12-07 DIAGNOSIS — E785 Hyperlipidemia, unspecified: Secondary | ICD-10-CM | POA: Insufficient documentation

## 2015-12-07 DIAGNOSIS — E871 Hypo-osmolality and hyponatremia: Secondary | ICD-10-CM | POA: Insufficient documentation

## 2015-12-07 DIAGNOSIS — R5383 Other fatigue: Secondary | ICD-10-CM | POA: Diagnosis not present

## 2015-12-07 DIAGNOSIS — M25562 Pain in left knee: Secondary | ICD-10-CM | POA: Diagnosis not present

## 2015-12-07 DIAGNOSIS — Z96651 Presence of right artificial knee joint: Secondary | ICD-10-CM | POA: Diagnosis not present

## 2015-12-07 HISTORY — DX: Dehydration: E86.0

## 2015-12-07 LAB — COMPREHENSIVE METABOLIC PANEL
ALBUMIN: 3.7 g/dL (ref 3.5–5.0)
ALK PHOS: 63 U/L (ref 38–126)
ALT: 19 U/L (ref 14–54)
AST: 24 U/L (ref 15–41)
Anion gap: 11 (ref 5–15)
BUN: 36 mg/dL — ABNORMAL HIGH (ref 6–20)
CALCIUM: 9.4 mg/dL (ref 8.9–10.3)
CHLORIDE: 104 mmol/L (ref 101–111)
CO2: 21 mmol/L — AB (ref 22–32)
CREATININE: 1.48 mg/dL — AB (ref 0.44–1.00)
GFR calc Af Amer: 40 mL/min — ABNORMAL LOW (ref >60)
GFR calc non Af Amer: 34 mL/min — ABNORMAL LOW (ref >60)
GLUCOSE: 166 mg/dL — AB (ref 65–99)
Potassium: 3.2 mmol/L — ABNORMAL LOW (ref 3.5–5.1)
SODIUM: 136 mmol/L (ref 135–145)
Total Bilirubin: 0.6 mg/dL (ref 0.3–1.2)
Total Protein: 6.7 g/dL (ref 6.5–8.1)

## 2015-12-07 LAB — CBC
HEMATOCRIT: 37.6 % (ref 36.0–46.0)
Hemoglobin: 12.1 g/dL (ref 12.0–15.0)
MCH: 30.1 pg (ref 26.0–34.0)
MCHC: 32.2 g/dL (ref 30.0–36.0)
MCV: 93.5 fL (ref 78.0–100.0)
Platelets: 273 10*3/uL (ref 150–400)
RBC: 4.02 MIL/uL (ref 3.87–5.11)
RDW: 14.1 % (ref 11.5–15.5)
WBC: 6.8 10*3/uL (ref 4.0–10.5)

## 2015-12-07 LAB — GLUCOSE, CAPILLARY
GLUCOSE-CAPILLARY: 139 mg/dL — AB (ref 65–99)
GLUCOSE-CAPILLARY: 117 mg/dL — AB (ref 65–99)

## 2015-12-07 LAB — PHOSPHORUS: Phosphorus: 4.6 mg/dL (ref 2.5–4.6)

## 2015-12-07 LAB — TROPONIN I: Troponin I: <0.03 ng/mL (ref <0.031)

## 2015-12-07 LAB — BRAIN NATRIURETIC PEPTIDE: B Natriuretic Peptide: 12.3 pg/mL (ref 0.0–100.0)

## 2015-12-07 LAB — MAGNESIUM: Magnesium: 2.4 mg/dL (ref 1.7–2.4)

## 2015-12-07 MED ORDER — INSULIN ASPART 100 UNIT/ML ~~LOC~~ SOLN
0.0000 [IU] | Freq: Three times a day (TID) | SUBCUTANEOUS | Status: DC
  Administered 2015-12-07: 2 [IU] via SUBCUTANEOUS

## 2015-12-07 MED ORDER — POTASSIUM CHLORIDE IN NACL 20-0.9 MEQ/L-% IV SOLN
INTRAVENOUS | Status: DC
  Administered 2015-12-07 – 2015-12-08 (×2): via INTRAVENOUS
  Filled 2015-12-07 (×4): qty 1000

## 2015-12-07 MED ORDER — ONDANSETRON HCL 4 MG PO TABS: 4.0000 mg | ORAL_TABLET | Freq: Four times a day (QID) | ORAL | Status: DC | PRN

## 2015-12-07 MED ORDER — ACETAMINOPHEN 325 MG PO TABS
650.0000 mg | ORAL_TABLET | Freq: Four times a day (QID) | ORAL | Status: DC | PRN
  Administered 2015-12-07 – 2015-12-08 (×3): 650 mg via ORAL
  Filled 2015-12-07 (×3): qty 2

## 2015-12-07 MED ORDER — POLYETHYLENE GLYCOL 3350 17 G PO PACK
17.0000 g | PACK | Freq: Every day | ORAL | Status: DC | PRN
  Administered 2015-12-07: 17 g via ORAL
  Filled 2015-12-07: qty 1

## 2015-12-07 MED ORDER — BISACODYL 10 MG RE SUPP
10.0000 mg | Freq: Once | RECTAL | Status: DC
  Filled 2015-12-07: qty 1

## 2015-12-07 MED ORDER — APIXABAN 2.5 MG PO TABS
2.5000 mg | ORAL_TABLET | Freq: Two times a day (BID) | ORAL | Status: DC
  Administered 2015-12-07 – 2015-12-08 (×3): 2.5 mg via ORAL
  Filled 2015-12-07 (×3): qty 1

## 2015-12-07 MED ORDER — INSULIN ASPART 100 UNIT/ML ~~LOC~~ SOLN: 0.0000 [IU] | Freq: Every day | SUBCUTANEOUS | Status: DC

## 2015-12-07 MED ORDER — DOCUSATE SODIUM 100 MG PO CAPS
100.0000 mg | ORAL_CAPSULE | Freq: Two times a day (BID) | ORAL | Status: DC
  Administered 2015-12-07 – 2015-12-08 (×3): 100 mg via ORAL
  Filled 2015-12-07 (×3): qty 1

## 2015-12-07 MED ORDER — FOLIC ACID 5 MG/ML IJ SOLN
1.0000 mg | Freq: Every day | INTRAMUSCULAR | Status: DC
  Administered 2015-12-08: 1 mg via INTRAVENOUS
  Filled 2015-12-07 (×2): qty 0.2

## 2015-12-07 MED ORDER — ONDANSETRON HCL 4 MG/2ML IJ SOLN: 4.0000 mg | Freq: Four times a day (QID) | INTRAMUSCULAR | Status: DC | PRN

## 2015-12-07 MED ORDER — SODIUM CHLORIDE 0.9 % IJ SOLN
3.0000 mL | Freq: Two times a day (BID) | INTRAMUSCULAR | Status: DC
  Administered 2015-12-08: 3 mL via INTRAVENOUS

## 2015-12-07 MED ORDER — ACETAMINOPHEN 650 MG RE SUPP: 650.0000 mg | Freq: Four times a day (QID) | RECTAL | Status: DC | PRN

## 2015-12-07 NOTE — H&P (Signed)
Natalie Riddle is an 69 y.o. female.   Chief Complaint: hypotension with hyponatremia HPI: 79 yowf with history of 3.5 weeks s/p total knee.  She has had nausea and vomiting for the past week.  She was seen by her PCP yesterday who drew labs to confirm dehydration and hyponatremia and hypokalemia.  She continues to have symptomatic hypotension and is being admitted for observation and rehydration.  EKG and troponin is ordered to make sure the nausea is not atypical chest pain in this diabetic.    Past Medical History  Diagnosis Date  . Hyperlipidemia     takes Atorvastatin daily  . Cancer ([place])   . Primary localized osteoarthritis of right knee   . Diabetes mellitus without complication ([place])     takes Metformin daily  . Hypertension     takes Benicar daily  . Glaucoma     uses eye drops daily and takes Diamox daily  . Joint pain   . Joint swelling   . History of gastric ulcer   . History of colon polyps     benign  . Diverticulosis   . Urinary urgency   . Sleep apnea     uses CPAP  . Macular degeneration     dry  . Asthmatic bronchitis     Past Surgical History  Procedure Laterality Date  . Cesarean section  39 yrs ago  . Tubal ligation  61yrs ago  . [place]    . Knee arthroscopy Right   . Colonoscopy    . Esophagogastroduodenoscopy    . Cartaract surgery Left   . Total knee arthroplasty Right 08/02/2015    Procedure: TOTAL KNEE ARTHROPLASTY;  Surgeon: Elsie Saas, MD;  Location: [facility];  Service: Orthopedics;  Laterality: Right;  . Total knee arthroplasty Left 11/15/2015    Procedure: LEFT TOTAL KNEE ARTHROPLASTY;  Surgeon: Elsie Saas, MD;  Location: [facility];  Service: Orthopedics;  Laterality: Left;    Family History  Problem Relation Age of Onset  . Hypertension    . Heart disease    . Lymphoma Mother   . Hypertension Father   . Heart attack Father    Social History:  reports that she quit smoking about 28 years ago. She does not have any smokeless  tobacco history on file. She reports that she drinks about 1.2 oz of alcohol per week. She reports that she does not use illicit drugs.  Allergies: No Known Allergies    Current outpatient prescriptions:  .  acetaZOLAMIDE (DIAMOX) 500 MG capsule, Take 500 mg by mouth daily., Disp: , Rfl:  .  apixaban (ELIQUIS) 2.5 MG TABS tablet, Take 1 tablet (2.5 mg total) by mouth every 12 (twelve) hours., Disp: 60 tablet, Rfl: 0 .  atorvastatin (LIPITOR) 20 MG tablet, Take 20 mg by mouth daily., Disp: , Rfl:  .  celecoxib (CELEBREX) 200 MG capsule, Take 200 mg by mouth daily., Disp: , Rfl:  .  Cholecalciferol (VITAMIN D) 2000 UNITS tablet, Take 2,000 Units by mouth daily., Disp: , Rfl:  .  cycloSPORINE (RESTASIS) 0.05 % ophthalmic emulsion, Place 1 drop into both eyes 2 (two) times daily., Disp: , Rfl:  .  dextromethorphan-guaiFENesin (MUCINEX DM) 30-600 MG 12hr tablet, Take 1 tablet by mouth 2 (two) times daily., Disp: , Rfl:  .  docusate sodium (COLACE) 100 MG capsule, 1 tab 2 times a day while on narcotics.  STOOL SOFTENER, Disp: 60 capsule, Rfl: 0 .  metFORMIN (GLUCOPHAGE) 500 MG tablet, Take  500 mg by mouth 2 (two) times daily with a meal., Disp: , Rfl:  .  Multiple Vitamins-Minerals (PRESERVISION AREDS 2 PO), Take 1 tablet by mouth daily., Disp: , Rfl:  .  olmesartan-hydrochlorothiazide (BENICAR HCT) 40-25 MG per tablet, Take 1 tablet by mouth daily., Disp: , Rfl:  .  oxyCODONE (OXY IR/ROXICODONE) 5 MG immediate release tablet, 1-2 tablets every 4-6 hrs as needed for pain, Disp: 100 tablet, Rfl: 0 .  polyethylene glycol (MIRALAX / GLYCOLAX) packet, 17grams in 16 oz of water twice a day until bowel movement.  LAXITIVE.  Restart if two days since last bowel movement, Disp: 60 each, Rfl: 0 .  Polyvinyl Alcohol (LIQUID TEARS OP), Apply 1 drop to eye daily as needed (dry eye). , Disp: , Rfl:  .  White Petrolatum-Mineral Oil (GENTEAL PM OP), Apply 1 drop to eye at bedtime. Preservative Free, Disp: , Rfl:     (Not in a hospital admission)  No results found for this or any previous visit (from the past 48 hour(s)). No results found.  Review of Systems  Constitutional: Positive for weight loss and malaise/fatigue. Negative for fever, chills and diaphoresis.  HENT: Negative.   Eyes: Negative.   Respiratory: Negative for cough, hemoptysis, sputum production, shortness of breath and wheezing.   Cardiovascular: Positive for leg swelling.  Gastrointestinal: Positive for nausea and vomiting. Negative for heartburn, abdominal pain, diarrhea, constipation, blood in stool and melena.  Genitourinary: Negative for dysuria, urgency, frequency, hematuria and flank pain.  Musculoskeletal: Positive for joint pain. Negative for myalgias, back pain, falls and neck pain.  Skin: Negative for rash.  Neurological: Positive for dizziness and weakness. Negative for tingling, tremors, sensory change, speech change, focal weakness, seizures and loss of consciousness.  Endo/Heme/Allergies: Negative.   Psychiatric/Behavioral: Negative.     Blood pressure 85/55, pulse 84, temperature 97.7 F (36.5 C), height 5\' 6"  (1.676 m), weight 97.977 kg (216 lb), SpO2 99 %. Physical Exam  Constitutional: She is oriented to person, place, and time. She appears well-developed.  Nauseated and not feeling well with Dizziness and weakness  HENT:  Head: Normocephalic and atraumatic.  Mouth/Throat: Oropharynx is clear and moist.  Eyes: EOM are normal. Pupils are equal, round, and reactive to light.  Neck: Neck supple.  Cardiovascular: Normal rate and regular rhythm.   Hypotensive with a BP of 86/59  Respiratory: Breath sounds normal.  GI: Bowel sounds are normal. She exhibits distension. She exhibits no mass. There is no tenderness. There is no rebound and no guarding.  Genitourinary:  Not pertinent to current symptomatology therefore not examined.  Musculoskeletal:  Left leg s/p acute total knee.  Had 2+ pitting edema a week  ago   Today.  No edema.  Slight dependent erythema that resolves on  Elevation.  4-107 degrees.  Right knee 3 months s/p total knee wound well healed with full strength and ROM  Neurological: She is alert and oriented to person, place, and time.  Skin: Skin is warm and dry.     Assessment Dehydration with hyponatrema  Plan Admit for observation, gentle rehydration and rule out cardiac causes of this symptomatic hypotension with hyponatremia and hypokalemia.  Irva Loser A. Kaleen Mask Physician Assistant [facility] [phone] office [phone] cell  12/07/2015, 12:49 PM  Linda Hedges 12/07/2015, 12:43 PM

## 2015-12-07 NOTE — H&P (Signed)
Natalie Riddle is an 69 y.o. female.   Chief Complaint: hypotension with hyponatremia HPI: 2 yowf with history of 3.5 weeks s/p total knee.  She has had nausea and vomiting for the past week.  She was seen by her PCP yesterday who drew labs to confirm dehydration and hyponatremia and hypokalemia.  She continues to have symptomatic hypotension and is being admitted for observation and rehydration.  EKG and troponin is ordered to make sure the nausea is not atypical chest pain in this diabetic.    Past Medical History  Diagnosis Date  . Hyperlipidemia     takes Atorvastatin daily  . Cancer ([place])   . Primary localized osteoarthritis of right knee   . Diabetes mellitus without complication ([place])     takes Metformin daily  . Hypertension     takes Benicar daily  . Glaucoma     uses eye drops daily and takes Diamox daily  . Joint pain   . Joint swelling   . History of gastric ulcer   . History of colon polyps     benign  . Diverticulosis   . Urinary urgency   . Sleep apnea     uses CPAP  . Macular degeneration     dry  . Asthmatic bronchitis     Past Surgical History  Procedure Laterality Date  . Cesarean section  41 yrs ago  . Tubal ligation  31yrs ago  . [place]    . Knee arthroscopy Right   . Colonoscopy    . Esophagogastroduodenoscopy    . Cartaract surgery Left   . Total knee arthroplasty Right 08/02/2015    Procedure: TOTAL KNEE ARTHROPLASTY;  Surgeon: Elsie Saas, MD;  Location: [facility];  Service: Orthopedics;  Laterality: Right;  . Total knee arthroplasty Left 11/15/2015    Procedure: LEFT TOTAL KNEE ARTHROPLASTY;  Surgeon: Elsie Saas, MD;  Location: [facility];  Service: Orthopedics;  Laterality: Left;    Family History  Problem Relation Age of Onset  . Hypertension    . Heart disease    . Lymphoma Mother   . Hypertension Father   . Heart attack Father    Social History:  reports that she quit smoking about 28 years ago. She does not have any smokeless  tobacco history on file. She reports that she drinks about 1.2 oz of alcohol per week. She reports that she does not use illicit drugs.  Allergies: No Known Allergies    Current outpatient prescriptions:  .  acetaZOLAMIDE (DIAMOX) 500 MG capsule, Take 500 mg by mouth daily., Disp: , Rfl:  .  apixaban (ELIQUIS) 2.5 MG TABS tablet, Take 1 tablet (2.5 mg total) by mouth every 12 (twelve) hours., Disp: 60 tablet, Rfl: 0 .  atorvastatin (LIPITOR) 20 MG tablet, Take 20 mg by mouth daily., Disp: , Rfl:  .  celecoxib (CELEBREX) 200 MG capsule, Take 200 mg by mouth daily., Disp: , Rfl:  .  Cholecalciferol (VITAMIN D) 2000 UNITS tablet, Take 2,000 Units by mouth daily., Disp: , Rfl:  .  cycloSPORINE (RESTASIS) 0.05 % ophthalmic emulsion, Place 1 drop into both eyes 2 (two) times daily., Disp: , Rfl:  .  dextromethorphan-guaiFENesin (MUCINEX DM) 30-600 MG 12hr tablet, Take 1 tablet by mouth 2 (two) times daily., Disp: , Rfl:  .  docusate sodium (COLACE) 100 MG capsule, 1 tab 2 times a day while on narcotics.  STOOL SOFTENER, Disp: 60 capsule, Rfl: 0 .  metFORMIN (GLUCOPHAGE) 500 MG tablet, Take  500 mg by mouth 2 (two) times daily with a meal., Disp: , Rfl:  .  Multiple Vitamins-Minerals (PRESERVISION AREDS 2 PO), Take 1 tablet by mouth daily., Disp: , Rfl:  .  olmesartan-hydrochlorothiazide (BENICAR HCT) 40-25 MG per tablet, Take 1 tablet by mouth daily., Disp: , Rfl:  .  oxyCODONE (OXY IR/ROXICODONE) 5 MG immediate release tablet, 1-2 tablets every 4-6 hrs as needed for pain, Disp: 100 tablet, Rfl: 0 .  polyethylene glycol (MIRALAX / GLYCOLAX) packet, 17grams in 16 oz of water twice a day until bowel movement.  LAXITIVE.  Restart if two days since last bowel movement, Disp: 60 each, Rfl: 0 .  Polyvinyl Alcohol (LIQUID TEARS OP), Apply 1 drop to eye daily as needed (dry eye). , Disp: , Rfl:  .  White Petrolatum-Mineral Oil (GENTEAL PM OP), Apply 1 drop to eye at bedtime. Preservative Free, Disp: , Rfl:     (Not in a hospital admission)  No results found for this or any previous visit (from the past 48 hour(s)). No results found.  Review of Systems  Constitutional: Positive for weight loss and malaise/fatigue. Negative for fever, chills and diaphoresis.  HENT: Negative.   Eyes: Negative.   Respiratory: Negative for cough, hemoptysis, sputum production, shortness of breath and wheezing.   Cardiovascular: Positive for leg swelling.  Gastrointestinal: Positive for nausea and vomiting. Negative for heartburn, abdominal pain, diarrhea, constipation, blood in stool and melena.  Genitourinary: Negative for dysuria, urgency, frequency, hematuria and flank pain.  Musculoskeletal: Positive for joint pain. Negative for myalgias, back pain, falls and neck pain.  Skin: Negative for rash.  Neurological: Positive for dizziness and weakness. Negative for tingling, tremors, sensory change, speech change, focal weakness, seizures and loss of consciousness.  Endo/Heme/Allergies: Negative.   Psychiatric/Behavioral: Negative.     Blood pressure 85/55, pulse 84, temperature 97.7 F (36.5 C), height 5\' 6"  (1.676 m), weight 97.977 kg (216 lb), SpO2 99 %. Physical Exam  Constitutional: She is oriented to person, place, and time. She appears well-developed.  Nauseated and not feeling well with Dizziness and weakness  HENT:  Head: Normocephalic and atraumatic.  Mouth/Throat: Oropharynx is clear and moist.  Eyes: EOM are normal. Pupils are equal, round, and reactive to light.  Neck: Neck supple.  Cardiovascular: Normal rate and regular rhythm.   Hypotensive with a BP of 86/59  Respiratory: Breath sounds normal.  GI: Bowel sounds are normal. She exhibits distension. She exhibits no mass. There is no tenderness. There is no rebound and no guarding.  Genitourinary:  Not pertinent to current symptomatology therefore not examined.  Musculoskeletal:  Left leg s/p acute total knee.  Had 2+ pitting edema a week  ago   Today.  No edema.  Slight dependent erythema that resolves on  Elevation.  4-107 degrees.  Right knee 3 months s/p total knee wound well healed with full strength and ROM  Neurological: She is alert and oriented to person, place, and time.  Skin: Skin is warm and dry.     Assessment Dehydration with hyponatrema  Plan Admit for observation, gentle rehydration and rule out cardiac causes of this symptomatic hypotension with hyponatremia and hypokalemia.  Stevee Valenta A. Kaleen Mask Physician Assistant [facility] [phone] office [phone] cell  12/07/2015, 12:49 PM  Linda Hedges 12/07/2015, 12:43 PM

## 2015-12-08 DIAGNOSIS — E871 Hypo-osmolality and hyponatremia: Secondary | ICD-10-CM | POA: Diagnosis not present

## 2015-12-08 DIAGNOSIS — R5383 Other fatigue: Secondary | ICD-10-CM | POA: Diagnosis not present

## 2015-12-08 DIAGNOSIS — R2240 Localized swelling, mass and lump, unspecified lower limb: Secondary | ICD-10-CM | POA: Diagnosis not present

## 2015-12-08 DIAGNOSIS — R42 Dizziness and giddiness: Secondary | ICD-10-CM | POA: Diagnosis not present

## 2015-12-08 DIAGNOSIS — R112 Nausea with vomiting, unspecified: Secondary | ICD-10-CM | POA: Diagnosis not present

## 2015-12-08 DIAGNOSIS — E860 Dehydration: Secondary | ICD-10-CM | POA: Diagnosis not present

## 2015-12-08 DIAGNOSIS — I959 Hypotension, unspecified: Secondary | ICD-10-CM | POA: Diagnosis not present

## 2015-12-08 LAB — BASIC METABOLIC PANEL
ANION GAP: 8 (ref 5–15)
BUN: 29 mg/dL — ABNORMAL HIGH (ref 6–20)
CALCIUM: 9.1 mg/dL (ref 8.9–10.3)
CO2: 22 mmol/L (ref 22–32)
Chloride: 108 mmol/L (ref 101–111)
Creatinine, Ser: 1.04 mg/dL — ABNORMAL HIGH (ref 0.44–1.00)
GFR, EST NON AFRICAN AMERICAN: 53 mL/min — AB (ref >60)
GLUCOSE: 132 mg/dL — AB (ref 65–99)
POTASSIUM: 3.5 mmol/L (ref 3.5–5.1)
SODIUM: 138 mmol/L (ref 135–145)

## 2015-12-08 LAB — CBC
HCT: 31.8 % — ABNORMAL LOW (ref 36.0–46.0)
Hemoglobin: 10.5 g/dL — ABNORMAL LOW (ref 12.0–15.0)
MCH: 30.4 pg (ref 26.0–34.0)
MCHC: 33 g/dL (ref 30.0–36.0)
MCV: 92.2 fL (ref 78.0–100.0)
PLATELETS: 237 10*3/uL (ref 150–400)
RBC: 3.45 MIL/uL — AB (ref 3.87–5.11)
RDW: 14.3 % (ref 11.5–15.5)
WBC: 5.6 10*3/uL (ref 4.0–10.5)

## 2015-12-08 LAB — TROPONIN I

## 2015-12-08 LAB — GLUCOSE, CAPILLARY
GLUCOSE-CAPILLARY: 119 mg/dL — AB (ref 65–99)
GLUCOSE-CAPILLARY: 106 mg/dL — AB (ref 65–99)

## 2015-12-08 MED ORDER — POTASSIUM CHLORIDE CRYS ER 20 MEQ PO TBCR
20.0000 meq | EXTENDED_RELEASE_TABLET | Freq: Two times a day (BID) | ORAL | Status: DC
  Administered 2015-12-08: 20 meq via ORAL
  Filled 2015-12-08 (×2): qty 1

## 2015-12-08 MED ORDER — METFORMIN HCL 500 MG PO TABS: ORAL_TABLET | ORAL | Status: AC

## 2015-12-08 MED ORDER — OLMESARTAN MEDOXOMIL-HCTZ 40-25 MG PO TABS: ORAL_TABLET | ORAL | Status: AC

## 2015-12-08 NOTE — Discharge Summary (Signed)
Patient ID: Natalie Riddle MRN: [id number] DOB/AGE: Apr 24, 1946 69 y.o.  Admit date: 12/07/2015 for overnight observation Discharge date: 12/08/2015  Admission Diagnoses:  Active Problems:   Dehydration with hyponatremia   Discharge Diagnoses:  Active Problems:   Dehydration with hyponatremia Renal Insuffienciency. Same  Past Medical History  Diagnosis Date  . Hyperlipidemia     takes Atorvastatin daily  . Cancer ([place])   . Primary localized osteoarthritis of right knee   . Diabetes mellitus without complication ([place])     takes Metformin daily  . Hypertension     takes Benicar daily  . Glaucoma     uses eye drops daily and takes Diamox daily  . Joint pain   . Joint swelling   . History of gastric ulcer   . History of colon polyps     benign  . Diverticulosis   . Urinary urgency   . Sleep apnea     uses CPAP  . Macular degeneration     dry  . Asthmatic bronchitis   . Dehydration with hyponatremia 12/07/2015    Surgeries:  on * No surgery found *   Consultants:  none  Discharged Condition: Improved  Hospital Course: Natalie Riddle is an 69 y.o. female who was admitted 12/07/2015 for  Dehydration and hyponatremia.  She had serial troponins done that were negative for cardiac injury.  CMET showed significant renal insult of the dehydration.  Rehydrated overnight.  Renal function improved.  Symptoms subsided and hyponatremia resolved  Patient was given perioperative antibiotics: Anti-infectives    None       Patient was given sequential compression devices, early ambulation, and chemoprophylaxis to prevent DVT.  Patient benefited maximally from hospital stay and there were no complications.    Recent vital signs: Patient Vitals for the past 24 hrs:  BP Temp Temp src Pulse Resp SpO2 Height Weight  12/08/15 0600 125/69 mmHg 98.2 F (36.8 C) Oral 89 18 98 % - 99.338 kg (219 lb)  12/07/15 2158 - - - 83 14 97 % - -  12/07/15 2059 (!) 110/58 mmHg  98.6 F (37 C) Oral 83 18 97 % - -  12/07/15 1510 108/68 mmHg - - - - - - -  12/07/15 1334 123/73 mmHg 97.7 F (36.5 C) Oral 94 16 99 % - -  12/07/15 1300 - - - - - - 5\' 6"  (1.676 m) 97.977 kg (216 lb)     Recent laboratory studies:  Recent Labs  12/07/15 1555 12/08/15 1005  WBC 6.8 5.6  HGB 12.1 10.5*  HCT 37.6 31.8*  PLT 273 237  NA 136 138  K 3.2* 3.5  CL 104 108  CO2 21* 22  BUN 36* 29*  CREATININE 1.48* 1.04*  GLUCOSE 166* 132*  CALCIUM 9.4 9.1     Discharge Medications:     Medication List    TAKE these medications        acetaZOLAMIDE 500 MG capsule  Commonly known as:  DIAMOX  Take 500 mg by mouth daily.     apixaban 2.5 MG Tabs tablet  Commonly known as:  ELIQUIS  Take 1 tablet (2.5 mg total) by mouth every 12 (twelve) hours.     atorvastatin 20 MG tablet  Commonly known as:  LIPITOR  Take 20 mg by mouth daily.     cycloSPORINE 0.05 % ophthalmic emulsion  Commonly known as:  RESTASIS  Place 1 drop into both eyes 2 (two) times daily.  docusate sodium 100 MG capsule  Commonly known as:  COLACE  1 tab 2 times a day while on narcotics.  STOOL SOFTENER     GENTEAL PM OP  Apply 1 drop to eye at bedtime as needed (dry eyes). Preservative Free     LIQUID TEARS OP  Apply 1 drop to eye daily as needed (dry eye).     metFORMIN 500 MG tablet  Commonly known as:  GLUCOPHAGE  Hold for 48hrs and then can resume     olmesartan-hydrochlorothiazide 40-25 MG tablet  Commonly known as:  BENICAR HCT  Hold until BP is greater than 130/90     polyethylene glycol packet  Commonly known as:  MIRALAX / GLYCOLAX  17grams in 16 oz of water twice a day until bowel movement.  LAXITIVE.  Restart if two days since last bowel movement     PRESERVISION AREDS 2 PO  Take 1 tablet by mouth daily.     Vitamin D 2000 UNITS tablet  Take 2,000 Units by mouth daily.        Diagnostic Studies: No results found.  Disposition: 06-Home-Health Care Svc       Discharge Instructions    Diet - low sodium heart healthy    Complete by:  As directed      Discharge instructions    Complete by:  As directed   OK to discharge to home.  Resume outpatient PT.  Hold metformin for 48 hrs due to renal insult.  Hold Benicar until BP greater than 130/90.  Push fluids.  Follow up in Dr Archie Endo office on 12-09-2015     Increase activity slowly    Complete by:  As directed               Signed: Linda Hedges 12/08/2015, 11:54 AM

## 2015-12-08 NOTE — Progress Notes (Signed)
Discharge instructions and teaching reviewed with pt. Prescriptions given. No further questions. Pt discharging home with son.

## 2015-12-09 DIAGNOSIS — Z96651 Presence of right artificial knee joint: Secondary | ICD-10-CM | POA: Diagnosis not present

## 2015-12-09 DIAGNOSIS — M25562 Pain in left knee: Secondary | ICD-10-CM | POA: Diagnosis not present

## 2015-12-09 DIAGNOSIS — R42 Dizziness and giddiness: Secondary | ICD-10-CM | POA: Diagnosis not present

## 2015-12-09 DIAGNOSIS — E860 Dehydration: Secondary | ICD-10-CM | POA: Diagnosis not present

## 2015-12-14 DIAGNOSIS — R531 Weakness: Secondary | ICD-10-CM | POA: Diagnosis not present

## 2015-12-14 DIAGNOSIS — J45909 Unspecified asthma, uncomplicated: Secondary | ICD-10-CM | POA: Diagnosis not present

## 2015-12-14 DIAGNOSIS — E119 Type 2 diabetes mellitus without complications: Secondary | ICD-10-CM | POA: Diagnosis not present

## 2015-12-14 DIAGNOSIS — I10 Essential (primary) hypertension: Secondary | ICD-10-CM | POA: Diagnosis not present

## 2015-12-14 DIAGNOSIS — H3530 Unspecified macular degeneration: Secondary | ICD-10-CM | POA: Diagnosis not present

## 2015-12-14 DIAGNOSIS — H409 Unspecified glaucoma: Secondary | ICD-10-CM | POA: Diagnosis not present

## 2015-12-16 DIAGNOSIS — R110 Nausea: Secondary | ICD-10-CM | POA: Diagnosis not present

## 2015-12-16 DIAGNOSIS — E119 Type 2 diabetes mellitus without complications: Secondary | ICD-10-CM | POA: Diagnosis not present

## 2015-12-16 DIAGNOSIS — H409 Unspecified glaucoma: Secondary | ICD-10-CM | POA: Diagnosis not present

## 2015-12-16 DIAGNOSIS — R000 Tachycardia, unspecified: Secondary | ICD-10-CM | POA: Diagnosis not present

## 2015-12-16 DIAGNOSIS — H3530 Unspecified macular degeneration: Secondary | ICD-10-CM | POA: Diagnosis not present

## 2015-12-16 DIAGNOSIS — R531 Weakness: Secondary | ICD-10-CM | POA: Diagnosis not present

## 2015-12-16 DIAGNOSIS — J45909 Unspecified asthma, uncomplicated: Secondary | ICD-10-CM | POA: Diagnosis not present

## 2015-12-16 DIAGNOSIS — I10 Essential (primary) hypertension: Secondary | ICD-10-CM | POA: Diagnosis not present

## 2015-12-29 DIAGNOSIS — E119 Type 2 diabetes mellitus without complications: Secondary | ICD-10-CM | POA: Diagnosis not present

## 2015-12-29 DIAGNOSIS — R531 Weakness: Secondary | ICD-10-CM | POA: Diagnosis not present

## 2015-12-29 DIAGNOSIS — I10 Essential (primary) hypertension: Secondary | ICD-10-CM | POA: Diagnosis not present

## 2015-12-29 DIAGNOSIS — H409 Unspecified glaucoma: Secondary | ICD-10-CM | POA: Diagnosis not present

## 2015-12-29 DIAGNOSIS — H3530 Unspecified macular degeneration: Secondary | ICD-10-CM | POA: Diagnosis not present

## 2015-12-29 DIAGNOSIS — J45909 Unspecified asthma, uncomplicated: Secondary | ICD-10-CM | POA: Diagnosis not present

## 2015-12-30 DIAGNOSIS — H409 Unspecified glaucoma: Secondary | ICD-10-CM | POA: Diagnosis not present

## 2015-12-30 DIAGNOSIS — H3530 Unspecified macular degeneration: Secondary | ICD-10-CM | POA: Diagnosis not present

## 2015-12-30 DIAGNOSIS — E119 Type 2 diabetes mellitus without complications: Secondary | ICD-10-CM | POA: Diagnosis not present

## 2015-12-30 DIAGNOSIS — I10 Essential (primary) hypertension: Secondary | ICD-10-CM | POA: Diagnosis not present

## 2015-12-30 DIAGNOSIS — J45909 Unspecified asthma, uncomplicated: Secondary | ICD-10-CM | POA: Diagnosis not present

## 2015-12-30 DIAGNOSIS — R531 Weakness: Secondary | ICD-10-CM | POA: Diagnosis not present

## 2015-12-31 DIAGNOSIS — R531 Weakness: Secondary | ICD-10-CM | POA: Diagnosis not present

## 2015-12-31 DIAGNOSIS — J45909 Unspecified asthma, uncomplicated: Secondary | ICD-10-CM | POA: Diagnosis not present

## 2015-12-31 DIAGNOSIS — E7800 Pure hypercholesterolemia, unspecified: Secondary | ICD-10-CM | POA: Diagnosis not present

## 2015-12-31 DIAGNOSIS — I10 Essential (primary) hypertension: Secondary | ICD-10-CM | POA: Diagnosis not present

## 2015-12-31 DIAGNOSIS — H409 Unspecified glaucoma: Secondary | ICD-10-CM | POA: Diagnosis not present

## 2015-12-31 DIAGNOSIS — H3530 Unspecified macular degeneration: Secondary | ICD-10-CM | POA: Diagnosis not present

## 2015-12-31 DIAGNOSIS — E119 Type 2 diabetes mellitus without complications: Secondary | ICD-10-CM | POA: Diagnosis not present

## 2016-01-04 DIAGNOSIS — H409 Unspecified glaucoma: Secondary | ICD-10-CM | POA: Diagnosis not present

## 2016-01-04 DIAGNOSIS — R531 Weakness: Secondary | ICD-10-CM | POA: Diagnosis not present

## 2016-01-04 DIAGNOSIS — H3530 Unspecified macular degeneration: Secondary | ICD-10-CM | POA: Diagnosis not present

## 2016-01-04 DIAGNOSIS — I10 Essential (primary) hypertension: Secondary | ICD-10-CM | POA: Diagnosis not present

## 2016-01-04 DIAGNOSIS — E119 Type 2 diabetes mellitus without complications: Secondary | ICD-10-CM | POA: Diagnosis not present

## 2016-01-04 DIAGNOSIS — J45909 Unspecified asthma, uncomplicated: Secondary | ICD-10-CM | POA: Diagnosis not present

## 2016-01-06 DIAGNOSIS — J45909 Unspecified asthma, uncomplicated: Secondary | ICD-10-CM | POA: Diagnosis not present

## 2016-01-06 DIAGNOSIS — R531 Weakness: Secondary | ICD-10-CM | POA: Diagnosis not present

## 2016-01-06 DIAGNOSIS — H409 Unspecified glaucoma: Secondary | ICD-10-CM | POA: Diagnosis not present

## 2016-01-06 DIAGNOSIS — H3530 Unspecified macular degeneration: Secondary | ICD-10-CM | POA: Diagnosis not present

## 2016-01-06 DIAGNOSIS — E119 Type 2 diabetes mellitus without complications: Secondary | ICD-10-CM | POA: Diagnosis not present

## 2016-01-06 DIAGNOSIS — I10 Essential (primary) hypertension: Secondary | ICD-10-CM | POA: Diagnosis not present

## 2016-01-10 DIAGNOSIS — M25562 Pain in left knee: Secondary | ICD-10-CM | POA: Diagnosis not present

## 2016-01-10 DIAGNOSIS — M25662 Stiffness of left knee, not elsewhere classified: Secondary | ICD-10-CM | POA: Diagnosis not present

## 2016-01-10 DIAGNOSIS — R262 Difficulty in walking, not elsewhere classified: Secondary | ICD-10-CM | POA: Diagnosis not present

## 2016-01-10 DIAGNOSIS — R531 Weakness: Secondary | ICD-10-CM | POA: Diagnosis not present

## 2016-01-12 DIAGNOSIS — R262 Difficulty in walking, not elsewhere classified: Secondary | ICD-10-CM | POA: Diagnosis not present

## 2016-01-12 DIAGNOSIS — M25662 Stiffness of left knee, not elsewhere classified: Secondary | ICD-10-CM | POA: Diagnosis not present

## 2016-01-12 DIAGNOSIS — M25562 Pain in left knee: Secondary | ICD-10-CM | POA: Diagnosis not present

## 2016-01-12 DIAGNOSIS — R531 Weakness: Secondary | ICD-10-CM | POA: Diagnosis not present

## 2016-01-17 DIAGNOSIS — R531 Weakness: Secondary | ICD-10-CM | POA: Diagnosis not present

## 2016-01-17 DIAGNOSIS — M25562 Pain in left knee: Secondary | ICD-10-CM | POA: Diagnosis not present

## 2016-01-17 DIAGNOSIS — R262 Difficulty in walking, not elsewhere classified: Secondary | ICD-10-CM | POA: Diagnosis not present

## 2016-01-17 DIAGNOSIS — M25662 Stiffness of left knee, not elsewhere classified: Secondary | ICD-10-CM | POA: Diagnosis not present

## 2016-01-20 DIAGNOSIS — R262 Difficulty in walking, not elsewhere classified: Secondary | ICD-10-CM | POA: Diagnosis not present

## 2016-01-20 DIAGNOSIS — M25662 Stiffness of left knee, not elsewhere classified: Secondary | ICD-10-CM | POA: Diagnosis not present

## 2016-01-20 DIAGNOSIS — M25562 Pain in left knee: Secondary | ICD-10-CM | POA: Diagnosis not present

## 2016-01-20 DIAGNOSIS — R531 Weakness: Secondary | ICD-10-CM | POA: Diagnosis not present

## 2016-01-21 DIAGNOSIS — H2511 Age-related nuclear cataract, right eye: Secondary | ICD-10-CM | POA: Diagnosis not present

## 2016-01-21 DIAGNOSIS — H401131 Primary open-angle glaucoma, bilateral, mild stage: Secondary | ICD-10-CM | POA: Diagnosis not present

## 2016-01-21 DIAGNOSIS — H1859 Other hereditary corneal dystrophies: Secondary | ICD-10-CM | POA: Diagnosis not present

## 2016-01-21 DIAGNOSIS — Z961 Presence of intraocular lens: Secondary | ICD-10-CM | POA: Diagnosis not present

## 2016-01-24 DIAGNOSIS — M25562 Pain in left knee: Secondary | ICD-10-CM | POA: Diagnosis not present

## 2016-01-24 DIAGNOSIS — R262 Difficulty in walking, not elsewhere classified: Secondary | ICD-10-CM | POA: Diagnosis not present

## 2016-01-24 DIAGNOSIS — R531 Weakness: Secondary | ICD-10-CM | POA: Diagnosis not present

## 2016-01-24 DIAGNOSIS — Z96651 Presence of right artificial knee joint: Secondary | ICD-10-CM | POA: Diagnosis not present

## 2016-01-24 DIAGNOSIS — M25662 Stiffness of left knee, not elsewhere classified: Secondary | ICD-10-CM | POA: Diagnosis not present

## 2016-01-27 DIAGNOSIS — M25662 Stiffness of left knee, not elsewhere classified: Secondary | ICD-10-CM | POA: Diagnosis not present

## 2016-01-27 DIAGNOSIS — R262 Difficulty in walking, not elsewhere classified: Secondary | ICD-10-CM | POA: Diagnosis not present

## 2016-01-27 DIAGNOSIS — M25562 Pain in left knee: Secondary | ICD-10-CM | POA: Diagnosis not present

## 2016-01-27 DIAGNOSIS — R531 Weakness: Secondary | ICD-10-CM | POA: Diagnosis not present

## 2016-01-31 DIAGNOSIS — R262 Difficulty in walking, not elsewhere classified: Secondary | ICD-10-CM | POA: Diagnosis not present

## 2016-01-31 DIAGNOSIS — M25562 Pain in left knee: Secondary | ICD-10-CM | POA: Diagnosis not present

## 2016-01-31 DIAGNOSIS — M25662 Stiffness of left knee, not elsewhere classified: Secondary | ICD-10-CM | POA: Diagnosis not present

## 2016-01-31 DIAGNOSIS — R531 Weakness: Secondary | ICD-10-CM | POA: Diagnosis not present

## 2016-02-03 DIAGNOSIS — M25662 Stiffness of left knee, not elsewhere classified: Secondary | ICD-10-CM | POA: Diagnosis not present

## 2016-02-03 DIAGNOSIS — R531 Weakness: Secondary | ICD-10-CM | POA: Diagnosis not present

## 2016-02-03 DIAGNOSIS — M25562 Pain in left knee: Secondary | ICD-10-CM | POA: Diagnosis not present

## 2016-02-03 DIAGNOSIS — R262 Difficulty in walking, not elsewhere classified: Secondary | ICD-10-CM | POA: Diagnosis not present

## 2016-02-07 DIAGNOSIS — R531 Weakness: Secondary | ICD-10-CM | POA: Diagnosis not present

## 2016-02-07 DIAGNOSIS — M25662 Stiffness of left knee, not elsewhere classified: Secondary | ICD-10-CM | POA: Diagnosis not present

## 2016-02-07 DIAGNOSIS — M25562 Pain in left knee: Secondary | ICD-10-CM | POA: Diagnosis not present

## 2016-02-07 DIAGNOSIS — R262 Difficulty in walking, not elsewhere classified: Secondary | ICD-10-CM | POA: Diagnosis not present

## 2016-02-09 ENCOUNTER — Other Ambulatory Visit: Payer: Self-pay | Admitting: Physician Assistant

## 2016-02-09 ENCOUNTER — Ambulatory Visit (HOSPITAL_COMMUNITY)
Admission: RE | Admit: 2016-02-09 | Discharge: 2016-02-09 | Disposition: A | Discharge status: Home / Self Care | Payer: Medicare Other | Source: Ambulatory Visit | Attending: Physician Assistant | Admitting: Physician Assistant

## 2016-02-09 DIAGNOSIS — E119 Type 2 diabetes mellitus without complications: Secondary | ICD-10-CM | POA: Diagnosis not present

## 2016-02-09 DIAGNOSIS — M7989 Other specified soft tissue disorders: Secondary | ICD-10-CM | POA: Insufficient documentation

## 2016-02-09 DIAGNOSIS — E785 Hyperlipidemia, unspecified: Secondary | ICD-10-CM | POA: Insufficient documentation

## 2016-02-09 DIAGNOSIS — I10 Essential (primary) hypertension: Secondary | ICD-10-CM | POA: Diagnosis not present

## 2016-02-09 DIAGNOSIS — R2240 Localized swelling, mass and lump, unspecified lower limb: Secondary | ICD-10-CM | POA: Diagnosis not present

## 2016-02-09 DIAGNOSIS — Z96652 Presence of left artificial knee joint: Secondary | ICD-10-CM | POA: Insufficient documentation

## 2016-02-09 DIAGNOSIS — Z96651 Presence of right artificial knee joint: Secondary | ICD-10-CM | POA: Diagnosis not present

## 2016-02-09 NOTE — Progress Notes (Signed)
VASCULAR LAB PRELIMINARY  PRELIMINARY  PRELIMINARY  PRELIMINARY  Left lower extremity venous duplex completed.    Preliminary report:  Left:  No evidence of DVT, superficial thrombosis, or Baker's cyst.  Natalie Riddle, RVT 02/09/2016, 5:28 PM

## 2016-02-14 DIAGNOSIS — M25662 Stiffness of left knee, not elsewhere classified: Secondary | ICD-10-CM | POA: Diagnosis not present

## 2016-02-14 DIAGNOSIS — R531 Weakness: Secondary | ICD-10-CM | POA: Diagnosis not present

## 2016-02-14 DIAGNOSIS — R262 Difficulty in walking, not elsewhere classified: Secondary | ICD-10-CM | POA: Diagnosis not present

## 2016-02-14 DIAGNOSIS — M25562 Pain in left knee: Secondary | ICD-10-CM | POA: Diagnosis not present

## 2016-02-21 DIAGNOSIS — M25662 Stiffness of left knee, not elsewhere classified: Secondary | ICD-10-CM | POA: Diagnosis not present

## 2016-02-21 DIAGNOSIS — M25562 Pain in left knee: Secondary | ICD-10-CM | POA: Diagnosis not present

## 2016-02-21 DIAGNOSIS — R262 Difficulty in walking, not elsewhere classified: Secondary | ICD-10-CM | POA: Diagnosis not present

## 2016-02-21 DIAGNOSIS — R531 Weakness: Secondary | ICD-10-CM | POA: Diagnosis not present

## 2016-02-23 ENCOUNTER — Other Ambulatory Visit: Payer: Self-pay | Admitting: Gastroenterology

## 2016-02-23 DIAGNOSIS — M25662 Stiffness of left knee, not elsewhere classified: Secondary | ICD-10-CM | POA: Diagnosis not present

## 2016-02-23 DIAGNOSIS — M25562 Pain in left knee: Secondary | ICD-10-CM | POA: Diagnosis not present

## 2016-02-23 DIAGNOSIS — R262 Difficulty in walking, not elsewhere classified: Secondary | ICD-10-CM | POA: Diagnosis not present

## 2016-02-23 DIAGNOSIS — R531 Weakness: Secondary | ICD-10-CM | POA: Diagnosis not present

## 2016-02-25 ENCOUNTER — Encounter (HOSPITAL_COMMUNITY): Payer: Self-pay | Admitting: *Deleted

## 2016-02-29 DIAGNOSIS — R262 Difficulty in walking, not elsewhere classified: Secondary | ICD-10-CM | POA: Diagnosis not present

## 2016-02-29 DIAGNOSIS — R531 Weakness: Secondary | ICD-10-CM | POA: Diagnosis not present

## 2016-02-29 DIAGNOSIS — M25562 Pain in left knee: Secondary | ICD-10-CM | POA: Diagnosis not present

## 2016-02-29 DIAGNOSIS — M25662 Stiffness of left knee, not elsewhere classified: Secondary | ICD-10-CM | POA: Diagnosis not present

## 2016-03-03 DIAGNOSIS — M25562 Pain in left knee: Secondary | ICD-10-CM | POA: Diagnosis not present

## 2016-03-03 DIAGNOSIS — R531 Weakness: Secondary | ICD-10-CM | POA: Diagnosis not present

## 2016-03-03 DIAGNOSIS — M25662 Stiffness of left knee, not elsewhere classified: Secondary | ICD-10-CM | POA: Diagnosis not present

## 2016-03-03 DIAGNOSIS — R262 Difficulty in walking, not elsewhere classified: Secondary | ICD-10-CM | POA: Diagnosis not present

## 2016-03-06 ENCOUNTER — Encounter (HOSPITAL_COMMUNITY): Payer: Self-pay | Admitting: *Deleted

## 2016-03-06 ENCOUNTER — Ambulatory Visit (HOSPITAL_COMMUNITY): Payer: Medicare Other | Admitting: Anesthesiology

## 2016-03-06 ENCOUNTER — Encounter (HOSPITAL_COMMUNITY)
Admission: RE | Disposition: A | Discharge status: Home / Self Care | Payer: Self-pay | Source: Ambulatory Visit | Attending: Gastroenterology

## 2016-03-06 ENCOUNTER — Ambulatory Visit (HOSPITAL_COMMUNITY)
Admission: RE | Admit: 2016-03-06 | Discharge: 2016-03-06 | Disposition: A | Discharge status: Home / Self Care | Payer: Medicare Other | Source: Ambulatory Visit | Attending: Gastroenterology | Admitting: Gastroenterology

## 2016-03-06 DIAGNOSIS — K635 Polyp of colon: Secondary | ICD-10-CM | POA: Diagnosis not present

## 2016-03-06 DIAGNOSIS — Z96653 Presence of artificial knee joint, bilateral: Secondary | ICD-10-CM | POA: Insufficient documentation

## 2016-03-06 DIAGNOSIS — Z7984 Long term (current) use of oral hypoglycemic drugs: Secondary | ICD-10-CM | POA: Insufficient documentation

## 2016-03-06 DIAGNOSIS — Z86010 Personal history of colonic polyps: Secondary | ICD-10-CM | POA: Insufficient documentation

## 2016-03-06 DIAGNOSIS — H3530 Unspecified macular degeneration: Secondary | ICD-10-CM | POA: Insufficient documentation

## 2016-03-06 DIAGNOSIS — Z1211 Encounter for screening for malignant neoplasm of colon: Secondary | ICD-10-CM | POA: Insufficient documentation

## 2016-03-06 DIAGNOSIS — D122 Benign neoplasm of ascending colon: Secondary | ICD-10-CM | POA: Diagnosis not present

## 2016-03-06 DIAGNOSIS — E119 Type 2 diabetes mellitus without complications: Secondary | ICD-10-CM | POA: Diagnosis not present

## 2016-03-06 DIAGNOSIS — Z9989 Dependence on other enabling machines and devices: Secondary | ICD-10-CM | POA: Insufficient documentation

## 2016-03-06 DIAGNOSIS — G4733 Obstructive sleep apnea (adult) (pediatric): Secondary | ICD-10-CM | POA: Diagnosis not present

## 2016-03-06 DIAGNOSIS — D123 Benign neoplasm of transverse colon: Secondary | ICD-10-CM | POA: Insufficient documentation

## 2016-03-06 DIAGNOSIS — I10 Essential (primary) hypertension: Secondary | ICD-10-CM | POA: Insufficient documentation

## 2016-03-06 DIAGNOSIS — H409 Unspecified glaucoma: Secondary | ICD-10-CM | POA: Diagnosis not present

## 2016-03-06 HISTORY — PX: COLONOSCOPY WITH PROPOFOL: SHX5780

## 2016-03-06 LAB — GLUCOSE, CAPILLARY: GLUCOSE-CAPILLARY: 98 mg/dL (ref 65–99)

## 2016-03-06 SURGERY — COLONOSCOPY WITH PROPOFOL
Anesthesia: Monitor Anesthesia Care

## 2016-03-06 MED ORDER — ONDANSETRON HCL 4 MG/2ML IJ SOLN: 4.0000 mg | Freq: Once | INTRAMUSCULAR | Status: DC | PRN

## 2016-03-06 MED ORDER — PROPOFOL 10 MG/ML IV BOLUS
INTRAVENOUS | Status: DC | PRN
  Administered 2016-03-06: 20 mg via INTRAVENOUS
  Administered 2016-03-06: 30 mg via INTRAVENOUS

## 2016-03-06 MED ORDER — LACTATED RINGERS IV SOLN
INTRAVENOUS | Status: DC
  Administered 2016-03-06: 1000 mL via INTRAVENOUS
  Administered 2016-03-06: 10:00:00 via INTRAVENOUS

## 2016-03-06 MED ORDER — SODIUM CHLORIDE 0.9 % IV SOLN: INTRAVENOUS | Status: DC

## 2016-03-06 MED ORDER — PROPOFOL 500 MG/50ML IV EMUL
INTRAVENOUS | Status: DC | PRN
  Administered 2016-03-06: 100 ug/kg/min via INTRAVENOUS

## 2016-03-06 MED ORDER — PROPOFOL 10 MG/ML IV BOLUS
INTRAVENOUS | Status: AC
  Filled 2016-03-06: qty 60

## 2016-03-06 MED ORDER — LIDOCAINE HCL (CARDIAC) 20 MG/ML IV SOLN
INTRAVENOUS | Status: AC
  Filled 2016-03-06: qty 5

## 2016-03-06 MED ORDER — LIDOCAINE HCL (CARDIAC) 20 MG/ML IV SOLN
INTRAVENOUS | Status: DC | PRN
  Administered 2016-03-06: 25 mg via INTRATRACHEAL

## 2016-03-06 MED ORDER — FENTANYL CITRATE (PF) 100 MCG/2ML IJ SOLN: 25.0000 ug | INTRAMUSCULAR | Status: DC | PRN

## 2016-03-06 SURGICAL SUPPLY — 21 items

## 2016-03-06 NOTE — Op Note (Signed)
[facility] Patient Name: Natalie Riddle Procedure Date: 03/06/2016 MRN: [id number] Attending MD: Garlan Fair , MD Date of Birth: 05-Dec-1946 CSN:  Age: 70 Admit Type: Outpatient Account #: [id number] Procedure:                Colonoscopy Indications:              High risk colon cancer surveillance: Personal                            history of adenoma less than 10 mm in size Providers:                Garlan Fair, MD, Hilma Favors, RN, Cletis Athens, Technician Referring MD:              Medicines:                Propofol per Anesthesia Complications:            No immediate complications. Estimated Blood Loss:      Procedure:                Pre-Anesthesia Assessment:                           - Prior to the procedure, a History and Physical                            was performed, and patient medications and                            allergies were reviewed. The patient is unable to                            give consent secondary to the patient being legally                            incompetent to consent. The risks and benefits of                            the procedure and the sedation options and risks                            were discussed with the patient. All questions were                            answered and informed consent was obtained. Patient                            identification and proposed procedure were verified                            in the pre-procedure area. Mental Status  Examination: alert and oriented. Airway                            Examination: normal oropharyngeal airway and neck                            mobility. Respiratory Examination: clear to                            auscultation. CV Examination: normal. Prophylactic                            Antibiotics: The patient does not require                            prophylactic antibiotics.  Prior Anticoagulants: The                            patient has taken aspirin, last dose was day of                            procedure. ASA Grade Assessment: II - A patient                            with mild systemic disease. After reviewing the                            risks and benefits, the patient was deemed in                            satisfactory condition to undergo the procedure.                            The anesthesia plan was to use monitored anesthesia                            care (MAC). Immediately prior to administration of                            medications, the patient was re-assessed for                            adequacy to receive sedatives. The heart rate,                            respiratory rate, oxygen saturations, blood                            pressure, adequacy of pulmonary ventilation, and                            response to care were monitored throughout the  procedure. The physical status of the patient was                            re-assessed after the procedure.                           After obtaining informed consent, the colonoscope                            was passed under direct vision. Throughout the                            procedure, the patient's blood pressure, pulse, and                            oxygen saturations were monitored continuously. The                            [id number] scope was introduced through                            the anus and advanced to the the cecum, identified                            by appendiceal orifice and ileocecal valve. The                            colonoscopy was performed without difficulty. The                            patient tolerated the procedure well. The ileocecal                            valve, the appendiceal orifice and the rectum were                            photographed. Scope In: 10:36:58 AM Scope Out: 10:59:31  AM Scope Withdrawal Time: 0 hours 11 minutes 17 seconds  Total Procedure Duration: 0 hours 22 minutes 33 seconds  Findings:      The perianal and digital rectal examinations were normal.      A 6 mm polyp was found in the mid transverse colon. The polyp was       sessile. The polyp was removed with a cold snare. Resection was       complete, and retrieval was complete.      A 6 mm polyp was found in the proximal ascending colon. The polyp was       sessile. The polyp was removed with a hot snare. Resection and retrieval       were complete.      The exam was otherwise without abnormality. Impression:               - One 6 mm polyp in the mid transverse colon,  removed with a cold snare. Resected and retrieved.                           - One 6 mm polyp in the proximal ascending colon,                            removed with a hot snare. Resected and retrieved.                           - The examination was otherwise normal. Moderate Sedation:      N/A- Per Anesthesia Care Recommendation:           - Patient has a contact number available for                            emergencies. The signs and symptoms of potential                            delayed complications were discussed with the                            patient. Return to normal activities tomorrow.                            Written discharge instructions were provided to the                            patient.                           - Patient has a contact number available for                            emergencies. The signs and symptoms of potential                            delayed complications were discussed with the                            patient. Return to normal activities tomorrow.                            Written discharge instructions were provided to the                            patient.                           - Resume previous diet.                           - No  aspirin, ibuprofen, naproxen, or other                            non-steroidal anti-inflammatory drugs for 2 weeks  after polyp removal. Procedure Code(s):        --- Professional ---                           [phone], Colonoscopy, flexible; with removal of                            tumor(s), polyp(s), or other lesion(s) by snare                            technique Diagnosis Code(s):        --- Professional ---                           Z86.010, Personal history of colonic polyps                           D12.3, Benign neoplasm of transverse colon (hepatic                            flexure or splenic flexure)                           D12.2, Benign neoplasm of ascending colon CPT copyright 2016 American Medical Association. All rights reserved. The codes documented in this report are preliminary and upon coder review may  be revised to meet current compliance requirements. Earle Gell, [facility] Garlan Fair, MD 03/06/2016 11:12:40 AM This report has been signed electronically. Number of Addenda: 0

## 2016-03-06 NOTE — H&P (Signed)
  Procedure: Surveillance colonoscopy. 10/17/2010 normal surveillance colonoscopy was performed. History of adenomatous colon polyps removed colonoscopically in the past  History: The patient is a 70 year old female born [birth year]. She is scheduled to undergo a surveillance colonoscopy today.  Past medical history: Type 2 diabetes mellitus. Hypertension. Peptic ulcer disease associated with H. pylori gastritis. Asthmatic bronchitis. Osteoarthritis of the knees. Right carpal tunnel syndrome. Glaucoma. Macular degeneration. Obstructive sleep apnea syndrome. Cesarean section. Bilateral tubal ligation. Bilateral total knee replacement surgeries. Left cataract surgery.  Medication allergies: Vasotec. Norvasc. Toprol. Diltiazem.  Exam: The patient is alert and lying comfortably on the endoscopy stretcher. Abdomen is soft and nontender to palpation. Lungs are clear to auscultation. Cardiac exam reveals a regular rhythm.  Plan: Proceed with surveillance colonoscopy

## 2016-03-06 NOTE — Anesthesia Postprocedure Evaluation (Signed)
Anesthesia Post Note  Patient: Natalie Riddle  Procedure(s) Performed: Procedure(s) (LRB): COLONOSCOPY WITH PROPOFOL (N/A)  Patient location during evaluation: PACU Anesthesia Type: MAC Level of consciousness: awake and alert Pain management: pain level controlled Vital Signs Assessment: post-procedure vital signs reviewed and stable Respiratory status: spontaneous breathing, nonlabored ventilation, respiratory function stable and patient connected to nasal cannula oxygen Cardiovascular status: stable and blood pressure returned to baseline Anesthetic complications: no    Last Vitals:  Filed Vitals:   03/06/16 0915  BP: 161/70  Pulse: 88  Temp: 36.7 C  Resp: 14    Last Pain:  Filed Vitals:   03/06/16 0916  PainSc: 3                  Zenaida Deed

## 2016-03-06 NOTE — Anesthesia Preprocedure Evaluation (Signed)
Anesthesia Evaluation  Patient identified by MRN, date of birth, ID band Patient awake    Reviewed: Allergy & Precautions, H&P , NPO status , Patient's Chart, lab work & pertinent test results  History of Anesthesia Complications Negative for: history of anesthetic complications  Airway Mallampati: II  TM Distance: >3 FB Neck ROM: full    Dental no notable dental hx.    Pulmonary asthma , sleep apnea and Continuous Positive Airway Pressure Ventilation , former smoker,    Pulmonary exam normal breath sounds clear to auscultation       Cardiovascular hypertension, Pt. on medications Normal cardiovascular exam Rhythm:regular Rate:Normal     Neuro/Psych negative neurological ROS     GI/Hepatic negative GI ROS, Neg liver ROS,   Endo/Other  diabetes, Type 2, Oral Hypoglycemic Agents  Renal/GU negative Renal ROS     Musculoskeletal  (+) Arthritis ,   Abdominal   Peds  Hematology negative hematology ROS (+)   Anesthesia Other Findings   Reproductive/Obstetrics negative OB ROS                             Anesthesia Physical Anesthesia Plan  ASA: III  Anesthesia Plan: MAC   Post-op Pain Management:    Induction: Intravenous  Airway Management Planned: Simple Face Mask  Additional Equipment:   Intra-op Plan:   Post-operative Plan:   Informed Consent: I have reviewed the patients History and Physical, chart, labs and discussed the procedure including the risks, benefits and alternatives for the proposed anesthesia with the patient or authorized representative who has indicated his/her understanding and acceptance.   Dental Advisory Given  Plan Discussed with: Anesthesiologist, CRNA and Surgeon  Anesthesia Plan Comments:         Anesthesia Quick Evaluation

## 2016-03-06 NOTE — Discharge Instructions (Signed)
Colonoscopy, Care After °Refer to this sheet in the next few weeks. These instructions provide you with information on caring for yourself after your procedure. Your health care provider may also give you more specific instructions. Your treatment has been planned according to current medical practices, but problems sometimes occur. Call your health care provider if you have any problems or questions after your procedure. °WHAT TO EXPECT AFTER THE PROCEDURE  °After your procedure, it is typical to have the following: °· A small amount of blood in your stool. °· Moderate amounts of gas and mild abdominal cramping or bloating. °HOME CARE INSTRUCTIONS °· Do not drive, operate machinery, or sign important documents for 24 hours. °· You may shower and resume your regular physical activities, but move at a slower pace for the first 24 hours. °· Take frequent rest periods for the first 24 hours. °· Walk around or put a warm pack on your abdomen to help reduce abdominal cramping and bloating. °· Drink enough fluids to keep your urine clear or pale yellow. °· You may resume your normal diet as instructed by your health care provider. Avoid heavy or fried foods that are hard to digest. °· Avoid drinking alcohol for 24 hours or as instructed by your health care provider. °· Only take over-the-counter or prescription medicines as directed by your health care provider. °· If a tissue sample (biopsy) was taken during your procedure: °¨ Do not take aspirin or blood thinners for 7 days, or as instructed by your health care provider. °¨ Do not drink alcohol for 7 days, or as instructed by your health care provider. °¨ Eat soft foods for the first 24 hours. °SEEK MEDICAL CARE IF: °You have persistent spotting of blood in your stool 2-3 days after the procedure. °SEEK IMMEDIATE MEDICAL CARE IF: °· You have more than a small spotting of blood in your stool. °· You pass large blood clots in your stool. °· Your abdomen is swollen  (distended). °· You have nausea or vomiting. °· You have a fever. °· You have increasing abdominal pain that is not relieved with medicine. °  °This information is not intended to replace advice given to you by your health care provider. Make sure you discuss any questions you have with your health care provider. °  °Document Released: 07/25/2004 Document Revised: 10/01/2013 Document Reviewed: 08/18/2013 °[employer]vier Interactive Patient Education ©2016 [employer] ° °

## 2016-03-06 NOTE — Transfer of Care (Signed)
Immediate Anesthesia Transfer of Care Note  Patient: Natalie Riddle  Procedure(s) Performed: Procedure(s): COLONOSCOPY WITH PROPOFOL (N/A)  Patient Location: PACU and Endoscopy Unit  Anesthesia Type:MAC  Level of Consciousness: awake, alert  and patient cooperative  Airway & Oxygen Therapy: Patient Spontanous Breathing and Patient connected to face mask oxygen  Post-op Assessment: Report given to RN and Post -op Vital signs reviewed and stable  Post vital signs: Reviewed and stable  Last Vitals:  Filed Vitals:   03/06/16 0915  BP: 161/70  Pulse: 88  Temp: 36.7 C  Resp: 14    Complications: No apparent anesthesia complications

## 2016-03-07 DIAGNOSIS — M25662 Stiffness of left knee, not elsewhere classified: Secondary | ICD-10-CM | POA: Diagnosis not present

## 2016-03-07 DIAGNOSIS — R262 Difficulty in walking, not elsewhere classified: Secondary | ICD-10-CM | POA: Diagnosis not present

## 2016-03-07 DIAGNOSIS — R531 Weakness: Secondary | ICD-10-CM | POA: Diagnosis not present

## 2016-03-07 DIAGNOSIS — M25562 Pain in left knee: Secondary | ICD-10-CM | POA: Diagnosis not present

## 2016-03-08 ENCOUNTER — Encounter (HOSPITAL_COMMUNITY): Payer: Self-pay | Admitting: Gastroenterology

## 2016-03-08 DIAGNOSIS — H401111 Primary open-angle glaucoma, right eye, mild stage: Secondary | ICD-10-CM | POA: Diagnosis not present

## 2016-03-08 DIAGNOSIS — H2511 Age-related nuclear cataract, right eye: Secondary | ICD-10-CM | POA: Diagnosis not present

## 2016-03-08 DIAGNOSIS — H1859 Other hereditary corneal dystrophies: Secondary | ICD-10-CM | POA: Diagnosis not present

## 2016-03-08 DIAGNOSIS — H401121 Primary open-angle glaucoma, left eye, mild stage: Secondary | ICD-10-CM | POA: Diagnosis not present

## 2016-03-09 DIAGNOSIS — R262 Difficulty in walking, not elsewhere classified: Secondary | ICD-10-CM | POA: Diagnosis not present

## 2016-03-09 DIAGNOSIS — M25562 Pain in left knee: Secondary | ICD-10-CM | POA: Diagnosis not present

## 2016-03-09 DIAGNOSIS — M25662 Stiffness of left knee, not elsewhere classified: Secondary | ICD-10-CM | POA: Diagnosis not present

## 2016-03-09 DIAGNOSIS — R531 Weakness: Secondary | ICD-10-CM | POA: Diagnosis not present

## 2016-03-13 DIAGNOSIS — M25662 Stiffness of left knee, not elsewhere classified: Secondary | ICD-10-CM | POA: Diagnosis not present

## 2016-03-13 DIAGNOSIS — R531 Weakness: Secondary | ICD-10-CM | POA: Diagnosis not present

## 2016-03-13 DIAGNOSIS — R262 Difficulty in walking, not elsewhere classified: Secondary | ICD-10-CM | POA: Diagnosis not present

## 2016-03-13 DIAGNOSIS — M25562 Pain in left knee: Secondary | ICD-10-CM | POA: Diagnosis not present

## 2016-03-15 DIAGNOSIS — R531 Weakness: Secondary | ICD-10-CM | POA: Diagnosis not present

## 2016-03-15 DIAGNOSIS — R262 Difficulty in walking, not elsewhere classified: Secondary | ICD-10-CM | POA: Diagnosis not present

## 2016-03-15 DIAGNOSIS — M25562 Pain in left knee: Secondary | ICD-10-CM | POA: Diagnosis not present

## 2016-03-15 DIAGNOSIS — M25662 Stiffness of left knee, not elsewhere classified: Secondary | ICD-10-CM | POA: Diagnosis not present

## 2016-04-03 DIAGNOSIS — E119 Type 2 diabetes mellitus without complications: Secondary | ICD-10-CM | POA: Diagnosis not present

## 2016-04-03 DIAGNOSIS — I10 Essential (primary) hypertension: Secondary | ICD-10-CM | POA: Diagnosis not present

## 2016-04-04 DIAGNOSIS — H401131 Primary open-angle glaucoma, bilateral, mild stage: Secondary | ICD-10-CM | POA: Diagnosis not present

## 2016-04-06 DIAGNOSIS — H6121 Impacted cerumen, right ear: Secondary | ICD-10-CM | POA: Diagnosis not present

## 2016-04-12 ENCOUNTER — Other Ambulatory Visit: Payer: Self-pay

## 2016-04-12 DIAGNOSIS — Z1231 Encounter for screening mammogram for malignant neoplasm of breast: Secondary | ICD-10-CM

## 2016-04-21 ENCOUNTER — Ambulatory Visit
Admission: RE | Admit: 2016-04-21 | Discharge: 2016-04-21 | Disposition: A | Discharge status: Home / Self Care | Payer: Medicare Other | Source: Ambulatory Visit

## 2016-04-21 DIAGNOSIS — Z1231 Encounter for screening mammogram for malignant neoplasm of breast: Secondary | ICD-10-CM | POA: Diagnosis not present

## 2016-04-24 DIAGNOSIS — Z7984 Long term (current) use of oral hypoglycemic drugs: Secondary | ICD-10-CM | POA: Diagnosis not present

## 2016-04-24 DIAGNOSIS — Z809 Family history of malignant neoplasm, unspecified: Secondary | ICD-10-CM | POA: Diagnosis not present

## 2016-04-24 DIAGNOSIS — Z961 Presence of intraocular lens: Secondary | ICD-10-CM | POA: Diagnosis not present

## 2016-04-24 DIAGNOSIS — Z96653 Presence of artificial knee joint, bilateral: Secondary | ICD-10-CM | POA: Diagnosis not present

## 2016-04-24 DIAGNOSIS — Z9849 Cataract extraction status, unspecified eye: Secondary | ICD-10-CM | POA: Diagnosis not present

## 2016-04-24 DIAGNOSIS — Z888 Allergy status to other drugs, medicaments and biological substances status: Secondary | ICD-10-CM | POA: Diagnosis not present

## 2016-04-24 DIAGNOSIS — Z83511 Family history of glaucoma: Secondary | ICD-10-CM | POA: Diagnosis not present

## 2016-04-24 DIAGNOSIS — H2511 Age-related nuclear cataract, right eye: Secondary | ICD-10-CM | POA: Diagnosis not present

## 2016-04-24 DIAGNOSIS — H401130 Primary open-angle glaucoma, bilateral, stage unspecified: Secondary | ICD-10-CM | POA: Diagnosis not present

## 2016-04-24 DIAGNOSIS — Z83518 Family history of other specified eye disorder: Secondary | ICD-10-CM | POA: Diagnosis not present

## 2016-04-24 DIAGNOSIS — Z885 Allergy status to narcotic agent status: Secondary | ICD-10-CM | POA: Diagnosis not present

## 2016-04-24 DIAGNOSIS — H1859 Other hereditary corneal dystrophies: Secondary | ICD-10-CM | POA: Diagnosis not present

## 2016-04-24 DIAGNOSIS — Z9851 Tubal ligation status: Secondary | ICD-10-CM | POA: Diagnosis not present

## 2016-04-24 DIAGNOSIS — E119 Type 2 diabetes mellitus without complications: Secondary | ICD-10-CM | POA: Diagnosis not present

## 2016-04-24 DIAGNOSIS — H35363 Drusen (degenerative) of macula, bilateral: Secondary | ICD-10-CM | POA: Diagnosis not present

## 2016-04-24 DIAGNOSIS — Z8249 Family history of ischemic heart disease and other diseases of the circulatory system: Secondary | ICD-10-CM | POA: Diagnosis not present

## 2016-04-24 DIAGNOSIS — Z87891 Personal history of nicotine dependence: Secondary | ICD-10-CM | POA: Diagnosis not present

## 2016-04-24 DIAGNOSIS — Z79899 Other long term (current) drug therapy: Secondary | ICD-10-CM | POA: Diagnosis not present

## 2016-04-25 DIAGNOSIS — Z96651 Presence of right artificial knee joint: Secondary | ICD-10-CM | POA: Diagnosis not present

## 2016-05-08 DIAGNOSIS — H401131 Primary open-angle glaucoma, bilateral, mild stage: Secondary | ICD-10-CM | POA: Diagnosis not present

## 2016-06-12 DIAGNOSIS — L814 Other melanin hyperpigmentation: Secondary | ICD-10-CM | POA: Diagnosis not present

## 2016-06-12 DIAGNOSIS — L821 Other seborrheic keratosis: Secondary | ICD-10-CM | POA: Diagnosis not present

## 2016-06-12 DIAGNOSIS — D1801 Hemangioma of skin and subcutaneous tissue: Secondary | ICD-10-CM | POA: Diagnosis not present

## 2016-06-12 DIAGNOSIS — L570 Actinic keratosis: Secondary | ICD-10-CM | POA: Diagnosis not present

## 2016-08-04 DIAGNOSIS — Z1389 Encounter for screening for other disorder: Secondary | ICD-10-CM | POA: Diagnosis not present

## 2016-08-04 DIAGNOSIS — I10 Essential (primary) hypertension: Secondary | ICD-10-CM | POA: Diagnosis not present

## 2016-08-04 DIAGNOSIS — E669 Obesity, unspecified: Secondary | ICD-10-CM | POA: Diagnosis not present

## 2016-08-04 DIAGNOSIS — Z6837 Body mass index (BMI) 37.0-37.9, adult: Secondary | ICD-10-CM | POA: Diagnosis not present

## 2016-08-04 DIAGNOSIS — E119 Type 2 diabetes mellitus without complications: Secondary | ICD-10-CM | POA: Diagnosis not present

## 2016-08-04 DIAGNOSIS — Z7984 Long term (current) use of oral hypoglycemic drugs: Secondary | ICD-10-CM | POA: Diagnosis not present

## 2016-08-04 DIAGNOSIS — G4733 Obstructive sleep apnea (adult) (pediatric): Secondary | ICD-10-CM | POA: Diagnosis not present

## 2016-09-11 DIAGNOSIS — Z7984 Long term (current) use of oral hypoglycemic drugs: Secondary | ICD-10-CM | POA: Diagnosis not present

## 2016-09-11 DIAGNOSIS — H401131 Primary open-angle glaucoma, bilateral, mild stage: Secondary | ICD-10-CM | POA: Diagnosis not present

## 2016-09-11 DIAGNOSIS — Z885 Allergy status to narcotic agent status: Secondary | ICD-10-CM | POA: Diagnosis not present

## 2016-09-11 DIAGNOSIS — Z83518 Family history of other specified eye disorder: Secondary | ICD-10-CM | POA: Diagnosis not present

## 2016-09-11 DIAGNOSIS — E119 Type 2 diabetes mellitus without complications: Secondary | ICD-10-CM | POA: Diagnosis not present

## 2016-09-11 DIAGNOSIS — H1859 Other hereditary corneal dystrophies: Secondary | ICD-10-CM | POA: Diagnosis not present

## 2016-09-11 DIAGNOSIS — H538 Other visual disturbances: Secondary | ICD-10-CM | POA: Diagnosis not present

## 2016-09-11 DIAGNOSIS — Z96653 Presence of artificial knee joint, bilateral: Secondary | ICD-10-CM | POA: Diagnosis not present

## 2016-09-11 DIAGNOSIS — Z961 Presence of intraocular lens: Secondary | ICD-10-CM | POA: Diagnosis not present

## 2016-09-11 DIAGNOSIS — Z83511 Family history of glaucoma: Secondary | ICD-10-CM | POA: Diagnosis not present

## 2016-09-11 DIAGNOSIS — Z8249 Family history of ischemic heart disease and other diseases of the circulatory system: Secondary | ICD-10-CM | POA: Diagnosis not present

## 2016-09-11 DIAGNOSIS — Z9889 Other specified postprocedural states: Secondary | ICD-10-CM | POA: Diagnosis not present

## 2016-09-11 DIAGNOSIS — Z809 Family history of malignant neoplasm, unspecified: Secondary | ICD-10-CM | POA: Diagnosis not present

## 2016-09-11 DIAGNOSIS — Z888 Allergy status to other drugs, medicaments and biological substances status: Secondary | ICD-10-CM | POA: Diagnosis not present

## 2016-09-11 DIAGNOSIS — H04123 Dry eye syndrome of bilateral lacrimal glands: Secondary | ICD-10-CM | POA: Diagnosis not present

## 2016-09-11 DIAGNOSIS — H2511 Age-related nuclear cataract, right eye: Secondary | ICD-10-CM | POA: Diagnosis not present

## 2016-09-11 DIAGNOSIS — Z9851 Tubal ligation status: Secondary | ICD-10-CM | POA: Diagnosis not present

## 2016-09-11 DIAGNOSIS — Z87891 Personal history of nicotine dependence: Secondary | ICD-10-CM | POA: Diagnosis not present

## 2016-09-11 DIAGNOSIS — Z79899 Other long term (current) drug therapy: Secondary | ICD-10-CM | POA: Diagnosis not present

## 2016-09-11 DIAGNOSIS — Z9849 Cataract extraction status, unspecified eye: Secondary | ICD-10-CM | POA: Diagnosis not present

## 2016-10-11 DIAGNOSIS — Z23 Encounter for immunization: Secondary | ICD-10-CM | POA: Diagnosis not present

## 2017-01-03 DIAGNOSIS — H401131 Primary open-angle glaucoma, bilateral, mild stage: Secondary | ICD-10-CM | POA: Diagnosis not present

## 2017-02-27 DIAGNOSIS — G4733 Obstructive sleep apnea (adult) (pediatric): Secondary | ICD-10-CM | POA: Diagnosis not present

## 2017-02-27 DIAGNOSIS — Z1389 Encounter for screening for other disorder: Secondary | ICD-10-CM | POA: Diagnosis not present

## 2017-02-27 DIAGNOSIS — Z0000 Encounter for general adult medical examination without abnormal findings: Secondary | ICD-10-CM | POA: Diagnosis not present

## 2017-02-27 DIAGNOSIS — E119 Type 2 diabetes mellitus without complications: Secondary | ICD-10-CM | POA: Diagnosis not present

## 2017-02-27 DIAGNOSIS — E7800 Pure hypercholesterolemia, unspecified: Secondary | ICD-10-CM | POA: Diagnosis not present

## 2017-02-27 DIAGNOSIS — I10 Essential (primary) hypertension: Secondary | ICD-10-CM | POA: Diagnosis not present

## 2017-05-30 DIAGNOSIS — H2511 Age-related nuclear cataract, right eye: Secondary | ICD-10-CM | POA: Diagnosis not present

## 2017-05-30 DIAGNOSIS — Z961 Presence of intraocular lens: Secondary | ICD-10-CM | POA: Diagnosis not present

## 2017-05-30 DIAGNOSIS — H401131 Primary open-angle glaucoma, bilateral, mild stage: Secondary | ICD-10-CM | POA: Diagnosis not present

## 2017-06-13 DIAGNOSIS — L570 Actinic keratosis: Secondary | ICD-10-CM | POA: Diagnosis not present

## 2017-06-13 DIAGNOSIS — D225 Melanocytic nevi of trunk: Secondary | ICD-10-CM | POA: Diagnosis not present

## 2017-06-13 DIAGNOSIS — Z85828 Personal history of other malignant neoplasm of skin: Secondary | ICD-10-CM | POA: Diagnosis not present

## 2017-06-13 DIAGNOSIS — L821 Other seborrheic keratosis: Secondary | ICD-10-CM | POA: Diagnosis not present

## 2017-06-13 DIAGNOSIS — L814 Other melanin hyperpigmentation: Secondary | ICD-10-CM | POA: Diagnosis not present

## 2017-06-13 DIAGNOSIS — D045 Carcinoma in situ of skin of trunk: Secondary | ICD-10-CM | POA: Diagnosis not present

## 2017-06-13 DIAGNOSIS — D0461 Carcinoma in situ of skin of right upper limb, including shoulder: Secondary | ICD-10-CM | POA: Diagnosis not present

## 2017-06-18 DIAGNOSIS — H401131 Primary open-angle glaucoma, bilateral, mild stage: Secondary | ICD-10-CM | POA: Diagnosis not present

## 2017-07-26 DIAGNOSIS — Z85828 Personal history of other malignant neoplasm of skin: Secondary | ICD-10-CM | POA: Diagnosis not present

## 2017-07-26 DIAGNOSIS — D0461 Carcinoma in situ of skin of right upper limb, including shoulder: Secondary | ICD-10-CM | POA: Diagnosis not present

## 2017-07-26 DIAGNOSIS — B353 Tinea pedis: Secondary | ICD-10-CM | POA: Diagnosis not present

## 2017-07-26 DIAGNOSIS — L570 Actinic keratosis: Secondary | ICD-10-CM | POA: Diagnosis not present

## 2017-08-30 DIAGNOSIS — I10 Essential (primary) hypertension: Secondary | ICD-10-CM | POA: Diagnosis not present

## 2017-08-30 DIAGNOSIS — E119 Type 2 diabetes mellitus without complications: Secondary | ICD-10-CM | POA: Diagnosis not present

## 2017-08-30 DIAGNOSIS — E1165 Type 2 diabetes mellitus with hyperglycemia: Secondary | ICD-10-CM | POA: Diagnosis not present

## 2017-09-27 DIAGNOSIS — Z23 Encounter for immunization: Secondary | ICD-10-CM | POA: Diagnosis not present

## 2018-01-21 DIAGNOSIS — H401131 Primary open-angle glaucoma, bilateral, mild stage: Secondary | ICD-10-CM | POA: Diagnosis not present

## 2018-01-22 DIAGNOSIS — Z0000 Encounter for general adult medical examination without abnormal findings: Secondary | ICD-10-CM | POA: Diagnosis not present

## 2018-01-29 DIAGNOSIS — Z111 Encounter for screening for respiratory tuberculosis: Secondary | ICD-10-CM | POA: Diagnosis not present

## 2018-02-22 DIAGNOSIS — J069 Acute upper respiratory infection, unspecified: Secondary | ICD-10-CM | POA: Diagnosis not present

## 2018-05-17 DIAGNOSIS — Z0000 Encounter for general adult medical examination without abnormal findings: Secondary | ICD-10-CM | POA: Diagnosis not present

## 2018-05-17 DIAGNOSIS — R5382 Chronic fatigue, unspecified: Secondary | ICD-10-CM | POA: Diagnosis not present

## 2018-05-17 DIAGNOSIS — Z1389 Encounter for screening for other disorder: Secondary | ICD-10-CM | POA: Diagnosis not present

## 2018-05-17 DIAGNOSIS — I10 Essential (primary) hypertension: Secondary | ICD-10-CM | POA: Diagnosis not present

## 2018-05-17 DIAGNOSIS — E7800 Pure hypercholesterolemia, unspecified: Secondary | ICD-10-CM | POA: Diagnosis not present

## 2018-05-17 DIAGNOSIS — E119 Type 2 diabetes mellitus without complications: Secondary | ICD-10-CM | POA: Diagnosis not present

## 2018-06-06 DIAGNOSIS — N183 Chronic kidney disease, stage 3 (moderate): Secondary | ICD-10-CM | POA: Diagnosis not present

## 2018-06-06 DIAGNOSIS — N179 Acute kidney failure, unspecified: Secondary | ICD-10-CM | POA: Diagnosis not present

## 2018-06-18 DIAGNOSIS — L986 Other infiltrative disorders of the skin and subcutaneous tissue: Secondary | ICD-10-CM | POA: Diagnosis not present

## 2018-06-18 DIAGNOSIS — D0462 Carcinoma in situ of skin of left upper limb, including shoulder: Secondary | ICD-10-CM | POA: Diagnosis not present

## 2018-06-18 DIAGNOSIS — D485 Neoplasm of uncertain behavior of skin: Secondary | ICD-10-CM | POA: Diagnosis not present

## 2018-06-18 DIAGNOSIS — D225 Melanocytic nevi of trunk: Secondary | ICD-10-CM | POA: Diagnosis not present

## 2018-06-18 DIAGNOSIS — L821 Other seborrheic keratosis: Secondary | ICD-10-CM | POA: Diagnosis not present

## 2018-06-18 DIAGNOSIS — D0471 Carcinoma in situ of skin of right lower limb, including hip: Secondary | ICD-10-CM | POA: Diagnosis not present

## 2018-06-18 DIAGNOSIS — L814 Other melanin hyperpigmentation: Secondary | ICD-10-CM | POA: Diagnosis not present

## 2018-06-18 DIAGNOSIS — D045 Carcinoma in situ of skin of trunk: Secondary | ICD-10-CM | POA: Diagnosis not present

## 2018-06-18 DIAGNOSIS — D0461 Carcinoma in situ of skin of right upper limb, including shoulder: Secondary | ICD-10-CM | POA: Diagnosis not present

## 2018-06-18 DIAGNOSIS — Z85828 Personal history of other malignant neoplasm of skin: Secondary | ICD-10-CM | POA: Diagnosis not present

## 2018-06-18 DIAGNOSIS — L570 Actinic keratosis: Secondary | ICD-10-CM | POA: Diagnosis not present

## 2018-06-26 MED FILL — SHINGRIX 50 MCG SUS: 50 | 1 days supply | Qty: 1 | Fill #0

## 2018-07-04 DIAGNOSIS — D0461 Carcinoma in situ of skin of right upper limb, including shoulder: Secondary | ICD-10-CM | POA: Diagnosis not present

## 2018-07-04 DIAGNOSIS — Z85828 Personal history of other malignant neoplasm of skin: Secondary | ICD-10-CM | POA: Diagnosis not present

## 2018-07-04 DIAGNOSIS — D0471 Carcinoma in situ of skin of right lower limb, including hip: Secondary | ICD-10-CM | POA: Diagnosis not present

## 2018-07-22 DIAGNOSIS — H401131 Primary open-angle glaucoma, bilateral, mild stage: Secondary | ICD-10-CM | POA: Diagnosis not present

## 2018-07-22 DIAGNOSIS — Z961 Presence of intraocular lens: Secondary | ICD-10-CM | POA: Diagnosis not present

## 2018-07-22 DIAGNOSIS — H2511 Age-related nuclear cataract, right eye: Secondary | ICD-10-CM | POA: Diagnosis not present

## 2018-09-03 MED FILL — SHINGRIX 50 MCG SUS: 50 | 1 days supply | Qty: 1 | Fill #1

## 2018-09-25 DIAGNOSIS — Z85828 Personal history of other malignant neoplasm of skin: Secondary | ICD-10-CM | POA: Diagnosis not present

## 2018-09-25 DIAGNOSIS — D485 Neoplasm of uncertain behavior of skin: Secondary | ICD-10-CM | POA: Diagnosis not present

## 2018-10-09 DIAGNOSIS — Z23 Encounter for immunization: Secondary | ICD-10-CM | POA: Diagnosis not present

## 2018-10-18 DIAGNOSIS — H401131 Primary open-angle glaucoma, bilateral, mild stage: Secondary | ICD-10-CM | POA: Diagnosis not present

## 2018-10-28 DIAGNOSIS — E1169 Type 2 diabetes mellitus with other specified complication: Secondary | ICD-10-CM | POA: Diagnosis not present

## 2018-10-28 DIAGNOSIS — G4733 Obstructive sleep apnea (adult) (pediatric): Secondary | ICD-10-CM | POA: Diagnosis not present

## 2018-10-28 DIAGNOSIS — H401131 Primary open-angle glaucoma, bilateral, mild stage: Secondary | ICD-10-CM | POA: Diagnosis not present

## 2018-10-28 DIAGNOSIS — I10 Essential (primary) hypertension: Secondary | ICD-10-CM | POA: Diagnosis not present

## 2018-10-28 DIAGNOSIS — E7800 Pure hypercholesterolemia, unspecified: Secondary | ICD-10-CM | POA: Diagnosis not present

## 2018-12-23 DIAGNOSIS — Z85828 Personal history of other malignant neoplasm of skin: Secondary | ICD-10-CM | POA: Diagnosis not present

## 2018-12-23 DIAGNOSIS — L814 Other melanin hyperpigmentation: Secondary | ICD-10-CM | POA: Diagnosis not present

## 2018-12-23 DIAGNOSIS — L821 Other seborrheic keratosis: Secondary | ICD-10-CM | POA: Diagnosis not present

## 2018-12-23 DIAGNOSIS — L570 Actinic keratosis: Secondary | ICD-10-CM | POA: Diagnosis not present

## 2019-05-05 MED FILL — ATORVASTATIN 20 MG TABLET: 20 | 90 days supply | Qty: 90 | Fill #0

## 2019-05-05 MED FILL — CELECOXIB 200 MG CAPSULE: 200 | 90 days supply | Qty: 90 | Fill #0

## 2019-05-05 MED FILL — OLMESARTAN-HCTZ 40-25 MG TA: 40-25 | 30 days supply | Qty: 30 | Fill #0

## 2019-05-06 MED FILL — POTASSIUM CHLORIDE CRYS ER: 20 | 90 days supply | Qty: 90 | Fill #0

## 2019-05-13 MED FILL — ESTRADIOL-NORETHINDRONE ACE: 1-0.5 | 84 days supply | Qty: 84 | Fill #0

## 2019-05-13 MED FILL — ACETAZOLAMIDE ER 500 MG CAP: 500 | 90 days supply | Qty: 90 | Fill #0

## 2019-05-22 DIAGNOSIS — Z0000 Encounter for general adult medical examination without abnormal findings: Secondary | ICD-10-CM | POA: Diagnosis not present

## 2019-05-22 DIAGNOSIS — I10 Essential (primary) hypertension: Secondary | ICD-10-CM | POA: Diagnosis not present

## 2019-05-22 DIAGNOSIS — G4733 Obstructive sleep apnea (adult) (pediatric): Secondary | ICD-10-CM | POA: Diagnosis not present

## 2019-05-22 DIAGNOSIS — Z1389 Encounter for screening for other disorder: Secondary | ICD-10-CM | POA: Diagnosis not present

## 2019-05-22 DIAGNOSIS — E7800 Pure hypercholesterolemia, unspecified: Secondary | ICD-10-CM | POA: Diagnosis not present

## 2019-05-22 DIAGNOSIS — E1169 Type 2 diabetes mellitus with other specified complication: Secondary | ICD-10-CM | POA: Diagnosis not present

## 2019-05-28 DIAGNOSIS — E7800 Pure hypercholesterolemia, unspecified: Secondary | ICD-10-CM | POA: Diagnosis not present

## 2019-05-28 DIAGNOSIS — E1169 Type 2 diabetes mellitus with other specified complication: Secondary | ICD-10-CM | POA: Diagnosis not present

## 2019-06-09 DIAGNOSIS — H1859 Other hereditary corneal dystrophies: Secondary | ICD-10-CM | POA: Diagnosis not present

## 2019-06-09 DIAGNOSIS — H401131 Primary open-angle glaucoma, bilateral, mild stage: Secondary | ICD-10-CM | POA: Diagnosis not present

## 2019-06-09 DIAGNOSIS — H3530 Unspecified macular degeneration: Secondary | ICD-10-CM | POA: Diagnosis not present

## 2019-06-09 DIAGNOSIS — H2511 Age-related nuclear cataract, right eye: Secondary | ICD-10-CM | POA: Diagnosis not present

## 2019-06-09 DIAGNOSIS — E119 Type 2 diabetes mellitus without complications: Secondary | ICD-10-CM | POA: Diagnosis not present

## 2019-06-09 DIAGNOSIS — Z961 Presence of intraocular lens: Secondary | ICD-10-CM | POA: Diagnosis not present

## 2019-06-12 MED FILL — OLMESARTAN-HCTZ 40-25 MG TA: 40-25 | 30 days supply | Qty: 30 | Fill #1

## 2019-06-24 DIAGNOSIS — L814 Other melanin hyperpigmentation: Secondary | ICD-10-CM | POA: Diagnosis not present

## 2019-06-24 DIAGNOSIS — Z85828 Personal history of other malignant neoplasm of skin: Secondary | ICD-10-CM | POA: Diagnosis not present

## 2019-06-24 DIAGNOSIS — L570 Actinic keratosis: Secondary | ICD-10-CM | POA: Diagnosis not present

## 2019-06-24 DIAGNOSIS — D044 Carcinoma in situ of skin of scalp and neck: Secondary | ICD-10-CM | POA: Diagnosis not present

## 2019-06-24 DIAGNOSIS — L821 Other seborrheic keratosis: Secondary | ICD-10-CM | POA: Diagnosis not present

## 2019-07-02 DIAGNOSIS — D044 Carcinoma in situ of skin of scalp and neck: Secondary | ICD-10-CM | POA: Diagnosis not present

## 2019-07-02 DIAGNOSIS — Z85828 Personal history of other malignant neoplasm of skin: Secondary | ICD-10-CM | POA: Diagnosis not present

## 2019-08-25 DIAGNOSIS — H409 Unspecified glaucoma: Secondary | ICD-10-CM | POA: Diagnosis not present

## 2019-08-25 DIAGNOSIS — I10 Essential (primary) hypertension: Secondary | ICD-10-CM | POA: Diagnosis not present

## 2019-08-25 DIAGNOSIS — M179 Osteoarthritis of knee, unspecified: Secondary | ICD-10-CM | POA: Diagnosis not present

## 2019-08-25 DIAGNOSIS — M19049 Primary osteoarthritis, unspecified hand: Secondary | ICD-10-CM | POA: Diagnosis not present

## 2019-08-25 DIAGNOSIS — E1169 Type 2 diabetes mellitus with other specified complication: Secondary | ICD-10-CM | POA: Diagnosis not present

## 2019-08-25 DIAGNOSIS — E7800 Pure hypercholesterolemia, unspecified: Secondary | ICD-10-CM | POA: Diagnosis not present

## 2019-08-27 DIAGNOSIS — H401131 Primary open-angle glaucoma, bilateral, mild stage: Secondary | ICD-10-CM | POA: Diagnosis not present

## 2019-09-25 DIAGNOSIS — Z23 Encounter for immunization: Secondary | ICD-10-CM | POA: Diagnosis not present

## 2019-11-24 DIAGNOSIS — I10 Essential (primary) hypertension: Secondary | ICD-10-CM | POA: Diagnosis not present

## 2019-11-24 DIAGNOSIS — E1169 Type 2 diabetes mellitus with other specified complication: Secondary | ICD-10-CM | POA: Diagnosis not present

## 2019-11-26 DIAGNOSIS — E1169 Type 2 diabetes mellitus with other specified complication: Secondary | ICD-10-CM | POA: Diagnosis not present

## 2020-01-19 ENCOUNTER — Ambulatory Visit: Payer: Medicare Other | Attending: Internal Medicine

## 2020-01-19 DIAGNOSIS — Z23 Encounter for immunization: Secondary | ICD-10-CM | POA: Insufficient documentation

## 2020-01-19 NOTE — Progress Notes (Signed)
   Covid-19 Vaccination Clinic  Name:  Natalie Riddle    MRN: [id number] DOB: [birth year]  01/19/2020  [name] was observed post Covid-19 immunization for 15 minutes without incidence. She was provided with Vaccine Information Sheet and instruction to access the V-Safe system.   [name] was instructed to call 911 with any severe reactions post vaccine: Marland Kitchen Difficulty breathing  . Swelling of your face and throat  . A fast heartbeat  . A bad rash all over your body  . Dizziness and weakness    Immunizations Administered    Name Date Dose VIS Date Route   Pfizer COVID-19 Vaccine 01/19/2020 10:16 AM 0.3 mL 12/05/2019 Intramuscular   Manufacturer: [place]   Lot: [id number]   [place]: [id number]

## 2020-02-09 ENCOUNTER — Ambulatory Visit: Payer: Medicare Other | Attending: Internal Medicine

## 2020-02-09 DIAGNOSIS — Z23 Encounter for immunization: Secondary | ICD-10-CM

## 2020-02-09 NOTE — Progress Notes (Signed)
   Covid-19 Vaccination Clinic  Name:  Natalie Riddle    MRN: [id number] DOB: 14-Mar-1946  02/09/2020  [name] was observed post Covid-19 immunization for 15 minutes without incidence. She was provided with Vaccine Information Sheet and instruction to access the V-Safe system.   [name] was instructed to call 911 with any severe reactions post vaccine: Marland Kitchen Difficulty breathing  . Swelling of your face and throat  . A fast heartbeat  . A bad rash all over your body  . Dizziness and weakness    Immunizations Administered    Name Date Dose VIS Date Route   Pfizer COVID-19 Vaccine 02/09/2020 10:47 AM 0.3 mL 12/05/2019 Intramuscular   Manufacturer: [place]   Lot: [id number]   [place]: [id number]

## 2020-02-13 DIAGNOSIS — M19049 Primary osteoarthritis, unspecified hand: Secondary | ICD-10-CM | POA: Diagnosis not present

## 2020-02-13 DIAGNOSIS — M179 Osteoarthritis of knee, unspecified: Secondary | ICD-10-CM | POA: Diagnosis not present

## 2020-02-13 DIAGNOSIS — E7800 Pure hypercholesterolemia, unspecified: Secondary | ICD-10-CM | POA: Diagnosis not present

## 2020-02-13 DIAGNOSIS — H409 Unspecified glaucoma: Secondary | ICD-10-CM | POA: Diagnosis not present

## 2020-02-13 DIAGNOSIS — I10 Essential (primary) hypertension: Secondary | ICD-10-CM | POA: Diagnosis not present

## 2020-02-13 DIAGNOSIS — E1169 Type 2 diabetes mellitus with other specified complication: Secondary | ICD-10-CM | POA: Diagnosis not present

## 2020-03-01 ENCOUNTER — Other Ambulatory Visit (HOSPITAL_COMMUNITY): Payer: Self-pay | Admitting: Optometry

## 2020-03-01 DIAGNOSIS — H04123 Dry eye syndrome of bilateral lacrimal glands: Secondary | ICD-10-CM | POA: Diagnosis not present

## 2020-03-01 DIAGNOSIS — Z961 Presence of intraocular lens: Secondary | ICD-10-CM | POA: Diagnosis not present

## 2020-03-01 DIAGNOSIS — H401131 Primary open-angle glaucoma, bilateral, mild stage: Secondary | ICD-10-CM | POA: Diagnosis not present

## 2020-03-01 DIAGNOSIS — H2511 Age-related nuclear cataract, right eye: Secondary | ICD-10-CM | POA: Diagnosis not present

## 2020-03-05 ENCOUNTER — Other Ambulatory Visit (HOSPITAL_COMMUNITY): Payer: Self-pay | Admitting: Internal Medicine

## 2020-06-25 DIAGNOSIS — H6121 Impacted cerumen, right ear: Secondary | ICD-10-CM | POA: Diagnosis not present

## 2020-07-01 ENCOUNTER — Other Ambulatory Visit: Payer: Self-pay | Admitting: Internal Medicine

## 2020-07-01 DIAGNOSIS — Z1231 Encounter for screening mammogram for malignant neoplasm of breast: Secondary | ICD-10-CM

## 2020-07-01 DIAGNOSIS — E2839 Other primary ovarian failure: Secondary | ICD-10-CM

## 2020-07-08 ENCOUNTER — Other Ambulatory Visit (HOSPITAL_COMMUNITY): Payer: Self-pay | Admitting: Internal Medicine

## 2020-09-21 DIAGNOSIS — Z23 Encounter for immunization: Secondary | ICD-10-CM | POA: Diagnosis not present

## 2020-09-22 DIAGNOSIS — Z23 Encounter for immunization: Secondary | ICD-10-CM | POA: Diagnosis not present

## 2020-09-28 ENCOUNTER — Other Ambulatory Visit: Payer: Self-pay

## 2020-09-28 ENCOUNTER — Ambulatory Visit
Admission: RE | Admit: 2020-09-28 | Discharge: 2020-09-28 | Disposition: A | Discharge status: Home / Self Care | Payer: Medicare Other | Source: Ambulatory Visit | Attending: Internal Medicine | Admitting: Internal Medicine

## 2020-09-28 DIAGNOSIS — Z1231 Encounter for screening mammogram for malignant neoplasm of breast: Secondary | ICD-10-CM

## 2020-09-28 DIAGNOSIS — E2839 Other primary ovarian failure: Secondary | ICD-10-CM

## 2020-09-28 DIAGNOSIS — Z780 Asymptomatic menopausal state: Secondary | ICD-10-CM | POA: Diagnosis not present

## 2020-10-01 DIAGNOSIS — G4733 Obstructive sleep apnea (adult) (pediatric): Secondary | ICD-10-CM | POA: Diagnosis not present

## 2020-11-02 ENCOUNTER — Other Ambulatory Visit: Payer: Self-pay

## 2020-11-02 ENCOUNTER — Ambulatory Visit
Admission: RE | Admit: 2020-11-02 | Discharge: 2020-11-02 | Disposition: A | Discharge status: Home / Self Care | Payer: Medicare Other | Source: Ambulatory Visit | Attending: Internal Medicine | Admitting: Internal Medicine

## 2020-11-02 DIAGNOSIS — Z1231 Encounter for screening mammogram for malignant neoplasm of breast: Secondary | ICD-10-CM | POA: Diagnosis not present

## 2020-12-14 ENCOUNTER — Other Ambulatory Visit (HOSPITAL_COMMUNITY): Payer: Self-pay | Admitting: Internal Medicine

## 2020-12-15 DIAGNOSIS — D0462 Carcinoma in situ of skin of left upper limb, including shoulder: Secondary | ICD-10-CM | POA: Diagnosis not present

## 2020-12-15 DIAGNOSIS — L821 Other seborrheic keratosis: Secondary | ICD-10-CM | POA: Diagnosis not present

## 2020-12-15 DIAGNOSIS — D0472 Carcinoma in situ of skin of left lower limb, including hip: Secondary | ICD-10-CM | POA: Diagnosis not present

## 2020-12-15 DIAGNOSIS — Z85828 Personal history of other malignant neoplasm of skin: Secondary | ICD-10-CM | POA: Diagnosis not present

## 2020-12-15 DIAGNOSIS — L814 Other melanin hyperpigmentation: Secondary | ICD-10-CM | POA: Diagnosis not present

## 2020-12-15 DIAGNOSIS — D485 Neoplasm of uncertain behavior of skin: Secondary | ICD-10-CM | POA: Diagnosis not present

## 2020-12-15 DIAGNOSIS — H61002 Unspecified perichondritis of left external ear: Secondary | ICD-10-CM | POA: Diagnosis not present

## 2020-12-15 DIAGNOSIS — L570 Actinic keratosis: Secondary | ICD-10-CM | POA: Diagnosis not present

## 2020-12-27 DIAGNOSIS — U071 COVID-19: Secondary | ICD-10-CM | POA: Diagnosis not present

## 2021-01-20 DIAGNOSIS — I10 Essential (primary) hypertension: Secondary | ICD-10-CM | POA: Diagnosis not present

## 2021-01-20 DIAGNOSIS — M179 Osteoarthritis of knee, unspecified: Secondary | ICD-10-CM | POA: Diagnosis not present

## 2021-01-20 DIAGNOSIS — E1169 Type 2 diabetes mellitus with other specified complication: Secondary | ICD-10-CM | POA: Diagnosis not present

## 2021-01-20 DIAGNOSIS — E7800 Pure hypercholesterolemia, unspecified: Secondary | ICD-10-CM | POA: Diagnosis not present

## 2021-02-04 ENCOUNTER — Other Ambulatory Visit (HOSPITAL_COMMUNITY): Payer: Self-pay | Admitting: Internal Medicine

## 2021-02-14 ENCOUNTER — Other Ambulatory Visit (HOSPITAL_COMMUNITY): Payer: Self-pay | Admitting: Optometry

## 2021-02-14 DIAGNOSIS — H04123 Dry eye syndrome of bilateral lacrimal glands: Secondary | ICD-10-CM | POA: Diagnosis not present

## 2021-02-14 DIAGNOSIS — Z961 Presence of intraocular lens: Secondary | ICD-10-CM | POA: Diagnosis not present

## 2021-02-14 DIAGNOSIS — H2511 Age-related nuclear cataract, right eye: Secondary | ICD-10-CM | POA: Diagnosis not present

## 2021-02-14 DIAGNOSIS — H401131 Primary open-angle glaucoma, bilateral, mild stage: Secondary | ICD-10-CM | POA: Diagnosis not present

## 2021-02-22 ENCOUNTER — Other Ambulatory Visit (HOSPITAL_COMMUNITY): Payer: Self-pay | Admitting: Internal Medicine

## 2021-02-23 ENCOUNTER — Other Ambulatory Visit (HOSPITAL_COMMUNITY): Payer: Self-pay | Admitting: Internal Medicine

## 2021-03-30 ENCOUNTER — Ambulatory Visit: Payer: Medicare Other | Attending: Internal Medicine

## 2021-03-30 DIAGNOSIS — Z23 Encounter for immunization: Secondary | ICD-10-CM

## 2021-03-30 NOTE — Progress Notes (Signed)
   Covid-19 Vaccination Clinic  Name:  Natalie Riddle    MRN: [id number] DOB: May 17, 1946  03/30/2021  [name] was observed post Covid-19 immunization for 15 minutes without incident. She was provided with Vaccine Information Sheet and instruction to access the V-Safe system.   [name] was instructed to call 911 with any severe reactions post vaccine: Marland Kitchen Difficulty breathing  . Swelling of face and throat  . A fast heartbeat  . A bad rash all over body  . Dizziness and weakness   Immunizations Administered    Name Date Dose VIS Date Route   Moderna Covid-19 Booster Vaccine 03/30/2021 11:50 AM 0.25 mL 10/13/2020 Intramuscular   Manufacturer: Moderna   Lot: [id number]   [place]: [id number]

## 2021-04-01 DIAGNOSIS — R21 Rash and other nonspecific skin eruption: Secondary | ICD-10-CM | POA: Diagnosis not present

## 2021-04-06 ENCOUNTER — Other Ambulatory Visit (HOSPITAL_COMMUNITY): Payer: Self-pay

## 2021-04-06 DIAGNOSIS — Z85828 Personal history of other malignant neoplasm of skin: Secondary | ICD-10-CM | POA: Diagnosis not present

## 2021-04-06 DIAGNOSIS — L2089 Other atopic dermatitis: Secondary | ICD-10-CM | POA: Diagnosis not present

## 2021-04-06 MED ORDER — TRIAMCINOLONE ACETONIDE 0.1 % EX CREA
TOPICAL_CREAM | Freq: Two times a day (BID) | CUTANEOUS | 2 refills | Status: AC
  Filled 2021-04-06: qty 454, 30d supply, fill #0

## 2021-04-06 MED ORDER — HYDROXYZINE HCL 25 MG PO TABS
25.0000 mg | ORAL_TABLET | Freq: Every day | ORAL | 1 refills | Status: AC
  Filled 2021-04-06: qty 30, 30d supply, fill #0

## 2021-04-11 ENCOUNTER — Other Ambulatory Visit (HOSPITAL_COMMUNITY): Payer: Self-pay

## 2021-04-11 MED ORDER — COVID-19 MRNA VACC (MODERNA) 100 MCG/0.5ML IM SUSP
INTRAMUSCULAR | 0 refills | Status: DC
  Filled 2021-04-11: qty 0.5, 1d supply, fill #0

## 2021-04-13 ENCOUNTER — Other Ambulatory Visit (HOSPITAL_COMMUNITY): Payer: Self-pay

## 2021-05-16 ENCOUNTER — Other Ambulatory Visit (HOSPITAL_COMMUNITY): Payer: Self-pay

## 2021-05-16 MED FILL — Cyclosporine (Ophth) Emulsion 0.05%: OPHTHALMIC | 30 days supply | Qty: 60 | Fill #0 | Status: AC

## 2021-05-23 ENCOUNTER — Other Ambulatory Visit (HOSPITAL_COMMUNITY): Payer: Self-pay

## 2021-05-23 MED FILL — Estradiol & Norethindrone Acetate Tab 1-0.5 MG: ORAL | 84 days supply | Qty: 84 | Fill #0 | Status: AC

## 2021-05-23 MED FILL — Celecoxib Cap 200 MG: ORAL | 90 days supply | Qty: 90 | Fill #0 | Status: AC

## 2021-05-24 ENCOUNTER — Other Ambulatory Visit (HOSPITAL_COMMUNITY): Payer: Self-pay

## 2021-05-24 MED ORDER — ACETAZOLAMIDE ER 500 MG PO CP12
500.0000 mg | ORAL_CAPSULE | Freq: Every day | ORAL | 3 refills | Status: AC
  Filled 2021-05-24: qty 90, 90d supply, fill #0
  Filled 2021-08-17: qty 90, 90d supply, fill #1
  Filled 2021-08-18: qty 90, 90d supply, fill #0
  Filled 2021-11-07: qty 90, 90d supply, fill #1
  Filled 2022-01-31: qty 90, 90d supply, fill #0

## 2021-05-24 MED ORDER — POTASSIUM CHLORIDE CRYS ER 20 MEQ PO TBCR
EXTENDED_RELEASE_TABLET | ORAL | 4 refills | Status: DC
  Filled 2021-05-24: qty 90, 90d supply, fill #0
  Filled 2021-08-17: qty 90, 90d supply, fill #1
  Filled 2021-08-18 – 2021-11-07 (×2): qty 90, 90d supply, fill #0

## 2021-05-24 MED ORDER — ATORVASTATIN CALCIUM 20 MG PO TABS
ORAL_TABLET | ORAL | 4 refills | Status: DC
  Filled 2021-05-24 – 2022-05-01 (×2): qty 90, 90d supply, fill #0

## 2021-06-06 ENCOUNTER — Other Ambulatory Visit (HOSPITAL_COMMUNITY): Payer: Self-pay

## 2021-06-23 DIAGNOSIS — Z85828 Personal history of other malignant neoplasm of skin: Secondary | ICD-10-CM | POA: Diagnosis not present

## 2021-06-23 DIAGNOSIS — L570 Actinic keratosis: Secondary | ICD-10-CM | POA: Diagnosis not present

## 2021-06-23 DIAGNOSIS — B078 Other viral warts: Secondary | ICD-10-CM | POA: Diagnosis not present

## 2021-06-23 DIAGNOSIS — L814 Other melanin hyperpigmentation: Secondary | ICD-10-CM | POA: Diagnosis not present

## 2021-06-23 DIAGNOSIS — L821 Other seborrheic keratosis: Secondary | ICD-10-CM | POA: Diagnosis not present

## 2021-06-29 ENCOUNTER — Other Ambulatory Visit (HOSPITAL_COMMUNITY): Payer: Self-pay

## 2021-07-04 DIAGNOSIS — Z6837 Body mass index (BMI) 37.0-37.9, adult: Secondary | ICD-10-CM | POA: Diagnosis not present

## 2021-07-04 DIAGNOSIS — Z1389 Encounter for screening for other disorder: Secondary | ICD-10-CM | POA: Diagnosis not present

## 2021-07-04 DIAGNOSIS — E7800 Pure hypercholesterolemia, unspecified: Secondary | ICD-10-CM | POA: Diagnosis not present

## 2021-07-04 DIAGNOSIS — Z7984 Long term (current) use of oral hypoglycemic drugs: Secondary | ICD-10-CM | POA: Diagnosis not present

## 2021-07-04 DIAGNOSIS — I10 Essential (primary) hypertension: Secondary | ICD-10-CM | POA: Diagnosis not present

## 2021-07-04 DIAGNOSIS — R5383 Other fatigue: Secondary | ICD-10-CM | POA: Diagnosis not present

## 2021-07-04 DIAGNOSIS — E1169 Type 2 diabetes mellitus with other specified complication: Secondary | ICD-10-CM | POA: Diagnosis not present

## 2021-07-04 DIAGNOSIS — D126 Benign neoplasm of colon, unspecified: Secondary | ICD-10-CM | POA: Diagnosis not present

## 2021-07-04 DIAGNOSIS — Z0000 Encounter for general adult medical examination without abnormal findings: Secondary | ICD-10-CM | POA: Diagnosis not present

## 2021-07-04 DIAGNOSIS — Z23 Encounter for immunization: Secondary | ICD-10-CM | POA: Diagnosis not present

## 2021-07-04 DIAGNOSIS — G4733 Obstructive sleep apnea (adult) (pediatric): Secondary | ICD-10-CM | POA: Diagnosis not present

## 2021-07-04 MED FILL — Amlodipine Besylate Tab 5 MG (Base Equivalent): ORAL | 90 days supply | Qty: 90 | Fill #0 | Status: AC

## 2021-07-05 ENCOUNTER — Other Ambulatory Visit (HOSPITAL_COMMUNITY): Payer: Self-pay

## 2021-07-05 MED ORDER — ATORVASTATIN CALCIUM 40 MG PO TABS
ORAL_TABLET | ORAL | 3 refills | Status: DC
  Filled 2021-07-05 – 2021-11-07 (×2): qty 90, 90d supply, fill #0

## 2021-07-05 MED ORDER — METFORMIN HCL ER 500 MG PO TB24
ORAL_TABLET | ORAL | 3 refills | Status: AC
  Filled 2021-07-05 – 2021-09-27 (×2): qty 90, 90d supply, fill #0
  Filled 2021-12-26: qty 90, 90d supply, fill #1

## 2021-07-22 ENCOUNTER — Other Ambulatory Visit (HOSPITAL_COMMUNITY): Payer: Self-pay

## 2021-07-22 MED FILL — Olmesartan Medoxomil-Hydrochlorothiazide Tab 40-25 MG: ORAL | Qty: 90 | Fill #0 | Status: CN

## 2021-07-22 MED FILL — Olmesartan Medoxomil-Hydrochlorothiazide Tab 40-25 MG: ORAL | 90 days supply | Qty: 90 | Fill #0 | Status: AC

## 2021-08-15 DIAGNOSIS — Z961 Presence of intraocular lens: Secondary | ICD-10-CM | POA: Diagnosis not present

## 2021-08-15 DIAGNOSIS — H2511 Age-related nuclear cataract, right eye: Secondary | ICD-10-CM | POA: Diagnosis not present

## 2021-08-15 DIAGNOSIS — E119 Type 2 diabetes mellitus without complications: Secondary | ICD-10-CM | POA: Diagnosis not present

## 2021-08-15 DIAGNOSIS — H401131 Primary open-angle glaucoma, bilateral, mild stage: Secondary | ICD-10-CM | POA: Diagnosis not present

## 2021-08-17 MED FILL — Estradiol & Norethindrone Acetate Tab 1-0.5 MG: ORAL | 84 days supply | Qty: 84 | Fill #1 | Status: CN

## 2021-08-17 MED FILL — Celecoxib Cap 200 MG: ORAL | 90 days supply | Qty: 90 | Fill #1 | Status: CN

## 2021-08-18 ENCOUNTER — Other Ambulatory Visit (HOSPITAL_COMMUNITY): Payer: Self-pay

## 2021-08-18 MED FILL — Estradiol & Norethindrone Acetate Tab 1-0.5 MG: ORAL | 84 days supply | Qty: 84 | Fill #0 | Status: AC

## 2021-08-18 MED FILL — Celecoxib Cap 200 MG: ORAL | 90 days supply | Qty: 90 | Fill #0 | Status: AC

## 2021-09-01 DIAGNOSIS — Z23 Encounter for immunization: Secondary | ICD-10-CM | POA: Diagnosis not present

## 2021-09-26 MED FILL — Amlodipine Besylate Tab 5 MG (Base Equivalent): ORAL | 90 days supply | Qty: 90 | Fill #1 | Status: CN

## 2021-09-27 ENCOUNTER — Other Ambulatory Visit (HOSPITAL_COMMUNITY): Payer: Self-pay

## 2021-09-27 DIAGNOSIS — Z23 Encounter for immunization: Secondary | ICD-10-CM | POA: Diagnosis not present

## 2021-09-27 MED ORDER — INFLUENZA VAC A&B SA ADJ QUAD 0.5 ML IM PRSY
PREFILLED_SYRINGE | INTRAMUSCULAR | 0 refills | Status: AC
  Filled 2021-09-27: qty 0.5, 1d supply, fill #0

## 2021-09-27 MED FILL — Amlodipine Besylate Tab 5 MG (Base Equivalent): ORAL | 90 days supply | Qty: 90 | Fill #0 | Status: AC

## 2021-09-27 MED FILL — Cyclosporine (Ophth) Emulsion 0.05%: OPHTHALMIC | 30 days supply | Qty: 60 | Fill #0 | Status: AC

## 2021-10-28 ENCOUNTER — Other Ambulatory Visit (HOSPITAL_COMMUNITY): Payer: Self-pay

## 2021-10-28 MED FILL — Olmesartan Medoxomil-Hydrochlorothiazide Tab 40-25 MG: ORAL | 90 days supply | Qty: 90 | Fill #0 | Status: AC

## 2021-11-07 ENCOUNTER — Other Ambulatory Visit (HOSPITAL_COMMUNITY): Payer: Self-pay

## 2021-11-07 MED FILL — Celecoxib Cap 200 MG: ORAL | 90 days supply | Qty: 90 | Fill #0 | Status: AC

## 2021-11-07 MED FILL — Estradiol & Norethindrone Acetate Tab 1-0.5 MG: ORAL | 84 days supply | Qty: 84 | Fill #0 | Status: AC

## 2021-12-06 DIAGNOSIS — D1801 Hemangioma of skin and subcutaneous tissue: Secondary | ICD-10-CM | POA: Diagnosis not present

## 2021-12-06 DIAGNOSIS — L570 Actinic keratosis: Secondary | ICD-10-CM | POA: Diagnosis not present

## 2021-12-06 DIAGNOSIS — L814 Other melanin hyperpigmentation: Secondary | ICD-10-CM | POA: Diagnosis not present

## 2021-12-06 DIAGNOSIS — D045 Carcinoma in situ of skin of trunk: Secondary | ICD-10-CM | POA: Diagnosis not present

## 2021-12-06 DIAGNOSIS — Z85828 Personal history of other malignant neoplasm of skin: Secondary | ICD-10-CM | POA: Diagnosis not present

## 2021-12-06 DIAGNOSIS — L821 Other seborrheic keratosis: Secondary | ICD-10-CM | POA: Diagnosis not present

## 2021-12-06 DIAGNOSIS — D0462 Carcinoma in situ of skin of left upper limb, including shoulder: Secondary | ICD-10-CM | POA: Diagnosis not present

## 2021-12-26 ENCOUNTER — Other Ambulatory Visit (HOSPITAL_COMMUNITY): Payer: Self-pay

## 2022-01-03 ENCOUNTER — Other Ambulatory Visit (HOSPITAL_COMMUNITY): Payer: Self-pay

## 2022-01-03 DIAGNOSIS — I10 Essential (primary) hypertension: Secondary | ICD-10-CM | POA: Diagnosis not present

## 2022-01-03 DIAGNOSIS — E1169 Type 2 diabetes mellitus with other specified complication: Secondary | ICD-10-CM | POA: Diagnosis not present

## 2022-01-03 MED ORDER — METFORMIN HCL ER 500 MG PO TB24
ORAL_TABLET | ORAL | 3 refills | Status: DC
  Filled 2022-01-03 – 2022-01-31 (×2): qty 180, 90d supply, fill #0
  Filled 2022-05-01: qty 180, 90d supply, fill #1
  Filled 2022-08-29: qty 180, 90d supply, fill #2
  Filled 2022-11-23: qty 180, 90d supply, fill #3

## 2022-01-05 ENCOUNTER — Other Ambulatory Visit (HOSPITAL_COMMUNITY): Payer: Self-pay

## 2022-01-05 MED FILL — Amlodipine Besylate Tab 5 MG (Base Equivalent): ORAL | 90 days supply | Qty: 90 | Fill #1 | Status: AC

## 2022-01-07 ENCOUNTER — Other Ambulatory Visit (HOSPITAL_COMMUNITY): Payer: Self-pay

## 2022-01-10 ENCOUNTER — Other Ambulatory Visit (HOSPITAL_COMMUNITY): Payer: Self-pay

## 2022-01-10 MED ORDER — OLMESARTAN MEDOXOMIL-HCTZ 40-25 MG PO TABS
ORAL_TABLET | Freq: Every day | ORAL | 3 refills | Status: DC
  Filled 2022-01-10: qty 90, 90d supply, fill #0
  Filled 2022-04-12: qty 90, 90d supply, fill #1
  Filled 2022-07-19: qty 90, 90d supply, fill #2
  Filled 2022-10-16: qty 90, 90d supply, fill #3

## 2022-01-31 ENCOUNTER — Other Ambulatory Visit (HOSPITAL_COMMUNITY): Payer: Self-pay

## 2022-02-01 ENCOUNTER — Other Ambulatory Visit (HOSPITAL_COMMUNITY): Payer: Self-pay

## 2022-02-01 MED ORDER — ESTRADIOL-NORETHINDRONE ACET 1-0.5 MG PO TABS
ORAL_TABLET | ORAL | 4 refills | Status: AC
  Filled 2022-02-01: qty 84, 84d supply, fill #0
  Filled 2022-04-26 – 2022-05-01 (×2): qty 84, 84d supply, fill #1
  Filled 2022-07-19: qty 84, 84d supply, fill #2
  Filled 2022-10-16: qty 84, 84d supply, fill #3
  Filled 2023-01-11: qty 84, 84d supply, fill #4

## 2022-02-01 MED ORDER — METFORMIN HCL ER 500 MG PO TB24
1000.0000 mg | ORAL_TABLET | Freq: Every day | ORAL | 3 refills | Status: AC
  Filled 2022-02-01: qty 180, 90d supply, fill #0

## 2022-02-01 MED ORDER — AMLODIPINE BESYLATE 5 MG PO TABS
10.0000 mg | ORAL_TABLET | Freq: Every day | ORAL | 3 refills | Status: DC
  Filled 2022-02-01 – 2022-02-08 (×2): qty 180, 90d supply, fill #0
  Filled 2022-06-05 – 2022-07-20 (×3): qty 180, 90d supply, fill #1
  Filled 2022-10-16: qty 180, 90d supply, fill #2
  Filled 2023-01-11: qty 180, 90d supply, fill #3

## 2022-02-02 ENCOUNTER — Other Ambulatory Visit (HOSPITAL_COMMUNITY): Payer: Self-pay

## 2022-02-02 MED ORDER — ATORVASTATIN CALCIUM 40 MG PO TABS
40.0000 mg | ORAL_TABLET | Freq: Every day | ORAL | 3 refills | Status: DC
  Filled 2022-02-02: qty 90, 90d supply, fill #0
  Filled 2022-04-26 – 2022-07-19 (×3): qty 90, 90d supply, fill #1
  Filled 2022-10-16: qty 90, 90d supply, fill #2
  Filled 2023-01-11: qty 90, 90d supply, fill #3

## 2022-02-02 MED ORDER — POTASSIUM CHLORIDE CRYS ER 20 MEQ PO TBCR
EXTENDED_RELEASE_TABLET | ORAL | 4 refills | Status: AC
  Filled 2022-02-02: qty 90, 90d supply, fill #0
  Filled 2022-04-26 – 2022-05-01 (×2): qty 90, 90d supply, fill #1
  Filled 2022-07-19: qty 90, 90d supply, fill #2
  Filled 2022-10-16: qty 90, 90d supply, fill #3
  Filled 2023-01-11: qty 90, 90d supply, fill #4

## 2022-02-02 MED ORDER — AMLODIPINE BESYLATE 5 MG PO TABS
ORAL_TABLET | ORAL | 3 refills | Status: DC
  Filled 2022-06-20: qty 90, 90d supply, fill #0

## 2022-02-03 ENCOUNTER — Other Ambulatory Visit (HOSPITAL_COMMUNITY): Payer: Self-pay

## 2022-02-03 MED ORDER — CELECOXIB 200 MG PO CAPS
ORAL_CAPSULE | ORAL | 0 refills | Status: DC
  Filled 2022-02-03: qty 90, 90d supply, fill #0

## 2022-02-08 ENCOUNTER — Other Ambulatory Visit (HOSPITAL_COMMUNITY): Payer: Self-pay

## 2022-02-20 DIAGNOSIS — H04123 Dry eye syndrome of bilateral lacrimal glands: Secondary | ICD-10-CM | POA: Diagnosis not present

## 2022-02-20 DIAGNOSIS — H401131 Primary open-angle glaucoma, bilateral, mild stage: Secondary | ICD-10-CM | POA: Diagnosis not present

## 2022-02-28 ENCOUNTER — Other Ambulatory Visit (HOSPITAL_COMMUNITY): Payer: Self-pay

## 2022-03-02 ENCOUNTER — Other Ambulatory Visit (HOSPITAL_COMMUNITY): Payer: Self-pay

## 2022-03-02 MED ORDER — PEG 3350-KCL-NA BICARB-NACL 420 G PO SOLR
ORAL | 0 refills | Status: AC
  Filled 2022-03-02: qty 4000, 1d supply, fill #0

## 2022-03-06 ENCOUNTER — Other Ambulatory Visit (HOSPITAL_COMMUNITY): Payer: Self-pay

## 2022-03-08 ENCOUNTER — Other Ambulatory Visit (HOSPITAL_COMMUNITY): Payer: Self-pay

## 2022-03-08 MED ORDER — CYCLOSPORINE 0.05 % OP EMUL
OPHTHALMIC | 99 refills | Status: AC
  Filled 2022-03-08: qty 180, 90d supply, fill #0

## 2022-03-09 DIAGNOSIS — K648 Other hemorrhoids: Secondary | ICD-10-CM | POA: Diagnosis not present

## 2022-03-09 DIAGNOSIS — D122 Benign neoplasm of ascending colon: Secondary | ICD-10-CM | POA: Diagnosis not present

## 2022-03-09 DIAGNOSIS — Z86010 Personal history of colonic polyps: Secondary | ICD-10-CM | POA: Diagnosis not present

## 2022-03-09 DIAGNOSIS — K5730 Diverticulosis of large intestine without perforation or abscess without bleeding: Secondary | ICD-10-CM | POA: Diagnosis not present

## 2022-03-14 DIAGNOSIS — D122 Benign neoplasm of ascending colon: Secondary | ICD-10-CM | POA: Diagnosis not present

## 2022-04-12 ENCOUNTER — Other Ambulatory Visit (HOSPITAL_COMMUNITY): Payer: Self-pay

## 2022-04-26 ENCOUNTER — Other Ambulatory Visit (HOSPITAL_COMMUNITY): Payer: Self-pay

## 2022-05-01 ENCOUNTER — Other Ambulatory Visit (HOSPITAL_COMMUNITY): Payer: Self-pay

## 2022-05-03 DIAGNOSIS — M545 Low back pain, unspecified: Secondary | ICD-10-CM | POA: Diagnosis not present

## 2022-05-09 ENCOUNTER — Other Ambulatory Visit (HOSPITAL_COMMUNITY): Payer: Self-pay

## 2022-05-11 DIAGNOSIS — M545 Low back pain, unspecified: Secondary | ICD-10-CM | POA: Diagnosis not present

## 2022-05-17 DIAGNOSIS — M545 Low back pain, unspecified: Secondary | ICD-10-CM | POA: Diagnosis not present

## 2022-05-25 DIAGNOSIS — M545 Low back pain, unspecified: Secondary | ICD-10-CM | POA: Diagnosis not present

## 2022-05-31 DIAGNOSIS — Z85828 Personal history of other malignant neoplasm of skin: Secondary | ICD-10-CM | POA: Diagnosis not present

## 2022-05-31 DIAGNOSIS — L309 Dermatitis, unspecified: Secondary | ICD-10-CM | POA: Diagnosis not present

## 2022-05-31 DIAGNOSIS — D1801 Hemangioma of skin and subcutaneous tissue: Secondary | ICD-10-CM | POA: Diagnosis not present

## 2022-05-31 DIAGNOSIS — L821 Other seborrheic keratosis: Secondary | ICD-10-CM | POA: Diagnosis not present

## 2022-05-31 DIAGNOSIS — L814 Other melanin hyperpigmentation: Secondary | ICD-10-CM | POA: Diagnosis not present

## 2022-05-31 DIAGNOSIS — L570 Actinic keratosis: Secondary | ICD-10-CM | POA: Diagnosis not present

## 2022-06-01 DIAGNOSIS — Z23 Encounter for immunization: Secondary | ICD-10-CM | POA: Diagnosis not present

## 2022-06-01 DIAGNOSIS — M545 Low back pain, unspecified: Secondary | ICD-10-CM | POA: Diagnosis not present

## 2022-06-05 ENCOUNTER — Other Ambulatory Visit (HOSPITAL_COMMUNITY): Payer: Self-pay

## 2022-06-05 MED ORDER — CELECOXIB 200 MG PO CAPS
ORAL_CAPSULE | ORAL | 3 refills | Status: AC
  Filled 2022-06-05 – 2022-06-20 (×2): qty 90, 90d supply, fill #0
  Filled 2023-04-03: qty 90, 90d supply, fill #1

## 2022-06-17 ENCOUNTER — Other Ambulatory Visit (HOSPITAL_COMMUNITY): Payer: Self-pay

## 2022-06-20 ENCOUNTER — Other Ambulatory Visit (HOSPITAL_COMMUNITY): Payer: Self-pay

## 2022-06-20 MED ORDER — ACETAZOLAMIDE ER 500 MG PO CP12
500.0000 mg | ORAL_CAPSULE | Freq: Every day | ORAL | 3 refills | Status: AC
  Filled 2022-06-20: qty 90, 90d supply, fill #0
  Filled 2022-09-16: qty 90, 90d supply, fill #1

## 2022-06-21 ENCOUNTER — Other Ambulatory Visit (HOSPITAL_COMMUNITY): Payer: Self-pay

## 2022-06-21 MED ORDER — ACETAZOLAMIDE ER 500 MG PO CP12
500.0000 mg | ORAL_CAPSULE | Freq: Every day | ORAL | 3 refills | Status: AC
  Filled 2022-11-23: qty 90, 90d supply, fill #0
  Filled 2023-02-12: qty 90, 90d supply, fill #1
  Filled 2023-06-10: qty 90, 90d supply, fill #2

## 2022-06-22 ENCOUNTER — Other Ambulatory Visit (HOSPITAL_COMMUNITY): Payer: Self-pay

## 2022-06-22 DIAGNOSIS — M545 Low back pain, unspecified: Secondary | ICD-10-CM | POA: Diagnosis not present

## 2022-06-29 DIAGNOSIS — M545 Low back pain, unspecified: Secondary | ICD-10-CM | POA: Diagnosis not present

## 2022-07-05 DIAGNOSIS — M7071 Other bursitis of hip, right hip: Secondary | ICD-10-CM | POA: Diagnosis not present

## 2022-07-05 DIAGNOSIS — M545 Low back pain, unspecified: Secondary | ICD-10-CM | POA: Diagnosis not present

## 2022-07-05 DIAGNOSIS — M533 Sacrococcygeal disorders, not elsewhere classified: Secondary | ICD-10-CM | POA: Diagnosis not present

## 2022-07-06 DIAGNOSIS — M545 Low back pain, unspecified: Secondary | ICD-10-CM | POA: Diagnosis not present

## 2022-07-08 ENCOUNTER — Other Ambulatory Visit (HOSPITAL_COMMUNITY): Payer: Self-pay

## 2022-07-10 ENCOUNTER — Other Ambulatory Visit (HOSPITAL_COMMUNITY): Payer: Self-pay

## 2022-07-10 DIAGNOSIS — Z0000 Encounter for general adult medical examination without abnormal findings: Secondary | ICD-10-CM | POA: Diagnosis not present

## 2022-07-10 DIAGNOSIS — E7800 Pure hypercholesterolemia, unspecified: Secondary | ICD-10-CM | POA: Diagnosis not present

## 2022-07-10 DIAGNOSIS — M159 Polyosteoarthritis, unspecified: Secondary | ICD-10-CM | POA: Diagnosis not present

## 2022-07-10 DIAGNOSIS — M545 Low back pain, unspecified: Secondary | ICD-10-CM | POA: Diagnosis not present

## 2022-07-10 DIAGNOSIS — G4733 Obstructive sleep apnea (adult) (pediatric): Secondary | ICD-10-CM | POA: Diagnosis not present

## 2022-07-10 DIAGNOSIS — Z1331 Encounter for screening for depression: Secondary | ICD-10-CM | POA: Diagnosis not present

## 2022-07-10 DIAGNOSIS — E1169 Type 2 diabetes mellitus with other specified complication: Secondary | ICD-10-CM | POA: Diagnosis not present

## 2022-07-10 DIAGNOSIS — Z6837 Body mass index (BMI) 37.0-37.9, adult: Secondary | ICD-10-CM | POA: Diagnosis not present

## 2022-07-10 DIAGNOSIS — I10 Essential (primary) hypertension: Secondary | ICD-10-CM | POA: Diagnosis not present

## 2022-07-10 DIAGNOSIS — Z872 Personal history of diseases of the skin and subcutaneous tissue: Secondary | ICD-10-CM | POA: Diagnosis not present

## 2022-07-10 MED ORDER — CELECOXIB 200 MG PO CAPS
ORAL_CAPSULE | ORAL | 3 refills | Status: DC
  Filled 2022-07-10 – 2022-07-20 (×2): qty 180, 90d supply, fill #0
  Filled 2022-10-16: qty 180, 90d supply, fill #1
  Filled 2023-01-11: qty 180, 90d supply, fill #2
  Filled 2023-06-23: qty 180, 90d supply, fill #3

## 2022-07-11 DIAGNOSIS — M545 Low back pain, unspecified: Secondary | ICD-10-CM | POA: Diagnosis not present

## 2022-07-13 DIAGNOSIS — M545 Low back pain, unspecified: Secondary | ICD-10-CM | POA: Diagnosis not present

## 2022-07-19 ENCOUNTER — Other Ambulatory Visit (HOSPITAL_COMMUNITY): Payer: Self-pay

## 2022-07-20 ENCOUNTER — Other Ambulatory Visit (HOSPITAL_COMMUNITY): Payer: Self-pay

## 2022-07-20 DIAGNOSIS — M545 Low back pain, unspecified: Secondary | ICD-10-CM | POA: Diagnosis not present

## 2022-07-27 DIAGNOSIS — M545 Low back pain, unspecified: Secondary | ICD-10-CM | POA: Diagnosis not present

## 2022-08-10 DIAGNOSIS — M545 Low back pain, unspecified: Secondary | ICD-10-CM | POA: Diagnosis not present

## 2022-08-15 DIAGNOSIS — M545 Low back pain, unspecified: Secondary | ICD-10-CM | POA: Diagnosis not present

## 2022-08-21 ENCOUNTER — Other Ambulatory Visit (HOSPITAL_COMMUNITY): Payer: Self-pay

## 2022-08-21 DIAGNOSIS — E119 Type 2 diabetes mellitus without complications: Secondary | ICD-10-CM | POA: Diagnosis not present

## 2022-08-21 DIAGNOSIS — Z961 Presence of intraocular lens: Secondary | ICD-10-CM | POA: Diagnosis not present

## 2022-08-21 DIAGNOSIS — H04123 Dry eye syndrome of bilateral lacrimal glands: Secondary | ICD-10-CM | POA: Diagnosis not present

## 2022-08-21 DIAGNOSIS — H2511 Age-related nuclear cataract, right eye: Secondary | ICD-10-CM | POA: Diagnosis not present

## 2022-08-21 DIAGNOSIS — H401131 Primary open-angle glaucoma, bilateral, mild stage: Secondary | ICD-10-CM | POA: Diagnosis not present

## 2022-08-21 MED ORDER — CYCLOSPORINE 0.05 % OP EMUL
OPHTHALMIC | 3 refills | Status: AC
Start: 2022-08-21 — End: ?
  Filled 2022-08-21: qty 180, 90d supply, fill #0

## 2022-08-24 DIAGNOSIS — M545 Low back pain, unspecified: Secondary | ICD-10-CM | POA: Diagnosis not present

## 2022-08-29 ENCOUNTER — Other Ambulatory Visit (HOSPITAL_COMMUNITY): Payer: Self-pay

## 2022-08-29 DIAGNOSIS — M545 Low back pain, unspecified: Secondary | ICD-10-CM | POA: Diagnosis not present

## 2022-08-31 DIAGNOSIS — M545 Low back pain, unspecified: Secondary | ICD-10-CM | POA: Diagnosis not present

## 2022-09-11 DIAGNOSIS — Z23 Encounter for immunization: Secondary | ICD-10-CM | POA: Diagnosis not present

## 2022-09-16 ENCOUNTER — Other Ambulatory Visit (HOSPITAL_COMMUNITY): Payer: Self-pay

## 2022-09-21 DIAGNOSIS — M545 Low back pain, unspecified: Secondary | ICD-10-CM | POA: Diagnosis not present

## 2022-09-27 DIAGNOSIS — M5416 Radiculopathy, lumbar region: Secondary | ICD-10-CM | POA: Diagnosis not present

## 2022-10-12 DIAGNOSIS — M545 Low back pain, unspecified: Secondary | ICD-10-CM | POA: Diagnosis not present

## 2022-10-14 ENCOUNTER — Other Ambulatory Visit (HOSPITAL_BASED_OUTPATIENT_CLINIC_OR_DEPARTMENT_OTHER): Payer: Self-pay

## 2022-10-16 ENCOUNTER — Other Ambulatory Visit (HOSPITAL_COMMUNITY): Payer: Self-pay

## 2022-10-19 DIAGNOSIS — M545 Low back pain, unspecified: Secondary | ICD-10-CM | POA: Diagnosis not present

## 2022-10-25 DIAGNOSIS — M545 Low back pain, unspecified: Secondary | ICD-10-CM | POA: Diagnosis not present

## 2022-11-02 DIAGNOSIS — M545 Low back pain, unspecified: Secondary | ICD-10-CM | POA: Diagnosis not present

## 2022-11-08 DIAGNOSIS — M545 Low back pain, unspecified: Secondary | ICD-10-CM | POA: Diagnosis not present

## 2022-11-20 DIAGNOSIS — M47816 Spondylosis without myelopathy or radiculopathy, lumbar region: Secondary | ICD-10-CM | POA: Diagnosis not present

## 2022-11-23 ENCOUNTER — Other Ambulatory Visit (HOSPITAL_COMMUNITY): Payer: Self-pay

## 2022-11-29 DIAGNOSIS — M545 Low back pain, unspecified: Secondary | ICD-10-CM | POA: Diagnosis not present

## 2022-11-30 DIAGNOSIS — L821 Other seborrheic keratosis: Secondary | ICD-10-CM | POA: Diagnosis not present

## 2022-11-30 DIAGNOSIS — L814 Other melanin hyperpigmentation: Secondary | ICD-10-CM | POA: Diagnosis not present

## 2022-11-30 DIAGNOSIS — Z85828 Personal history of other malignant neoplasm of skin: Secondary | ICD-10-CM | POA: Diagnosis not present

## 2022-11-30 DIAGNOSIS — C44622 Squamous cell carcinoma of skin of right upper limb, including shoulder: Secondary | ICD-10-CM | POA: Diagnosis not present

## 2022-11-30 DIAGNOSIS — B353 Tinea pedis: Secondary | ICD-10-CM | POA: Diagnosis not present

## 2022-11-30 DIAGNOSIS — L570 Actinic keratosis: Secondary | ICD-10-CM | POA: Diagnosis not present

## 2022-11-30 DIAGNOSIS — D225 Melanocytic nevi of trunk: Secondary | ICD-10-CM | POA: Diagnosis not present

## 2023-01-11 ENCOUNTER — Other Ambulatory Visit: Payer: Self-pay

## 2023-01-11 ENCOUNTER — Other Ambulatory Visit (HOSPITAL_COMMUNITY): Payer: Self-pay

## 2023-01-11 MED ORDER — OLMESARTAN MEDOXOMIL-HCTZ 40-25 MG PO TABS
1.0000 | ORAL_TABLET | Freq: Every day | ORAL | 3 refills | Status: AC
Start: 2023-01-11 — End: ?
  Filled 2023-01-11 – 2023-01-25 (×2): qty 90, 90d supply, fill #0
  Filled 2023-04-03: qty 90, 90d supply, fill #1
  Filled 2023-06-23: qty 90, 90d supply, fill #2
  Filled 2023-09-10: qty 90, 90d supply, fill #3

## 2023-01-12 ENCOUNTER — Other Ambulatory Visit (HOSPITAL_COMMUNITY): Payer: Self-pay

## 2023-01-18 DIAGNOSIS — R2689 Other abnormalities of gait and mobility: Secondary | ICD-10-CM | POA: Diagnosis not present

## 2023-01-18 DIAGNOSIS — E1169 Type 2 diabetes mellitus with other specified complication: Secondary | ICD-10-CM | POA: Diagnosis not present

## 2023-01-18 DIAGNOSIS — I10 Essential (primary) hypertension: Secondary | ICD-10-CM | POA: Diagnosis not present

## 2023-01-23 DIAGNOSIS — M5416 Radiculopathy, lumbar region: Secondary | ICD-10-CM | POA: Diagnosis not present

## 2023-01-25 ENCOUNTER — Other Ambulatory Visit (HOSPITAL_COMMUNITY): Payer: Self-pay

## 2023-02-12 ENCOUNTER — Other Ambulatory Visit (HOSPITAL_COMMUNITY): Payer: Self-pay

## 2023-02-12 MED ORDER — METFORMIN HCL ER 500 MG PO TB24
1000.0000 mg | ORAL_TABLET | Freq: Every day | ORAL | 2 refills | Status: DC
  Filled 2023-02-12: qty 180, 90d supply, fill #0
  Filled 2023-05-09: qty 180, 90d supply, fill #1
  Filled 2023-08-09 (×2): qty 180, 90d supply, fill #2

## 2023-02-22 ENCOUNTER — Other Ambulatory Visit: Payer: Self-pay

## 2023-02-22 DIAGNOSIS — Z961 Presence of intraocular lens: Secondary | ICD-10-CM | POA: Diagnosis not present

## 2023-02-22 DIAGNOSIS — H353131 Nonexudative age-related macular degeneration, bilateral, early dry stage: Secondary | ICD-10-CM | POA: Diagnosis not present

## 2023-02-22 DIAGNOSIS — H2511 Age-related nuclear cataract, right eye: Secondary | ICD-10-CM | POA: Diagnosis not present

## 2023-02-22 DIAGNOSIS — H401131 Primary open-angle glaucoma, bilateral, mild stage: Secondary | ICD-10-CM | POA: Diagnosis not present

## 2023-02-22 DIAGNOSIS — H04123 Dry eye syndrome of bilateral lacrimal glands: Secondary | ICD-10-CM | POA: Diagnosis not present

## 2023-02-22 MED ORDER — ACETAZOLAMIDE ER 500 MG PO CP12: 500.0000 mg | ORAL_CAPSULE | Freq: Every day | ORAL | 3 refills | Status: AC

## 2023-02-22 MED ORDER — CYCLOSPORINE 0.05 % OP EMUL
1.0000 [drp] | Freq: Two times a day (BID) | OPHTHALMIC | 3 refills | Status: AC
  Filled 2023-02-22: qty 180, 90d supply, fill #0

## 2023-02-23 ENCOUNTER — Other Ambulatory Visit: Payer: Self-pay

## 2023-02-26 ENCOUNTER — Other Ambulatory Visit (HOSPITAL_COMMUNITY): Payer: Self-pay

## 2023-03-30 ENCOUNTER — Other Ambulatory Visit (HOSPITAL_COMMUNITY): Payer: Self-pay

## 2023-03-30 MED ORDER — CYCLOSPORINE 0.05 % OP EMUL
1.0000 [drp] | Freq: Two times a day (BID) | OPHTHALMIC | 3 refills | Status: AC
  Filled 2023-03-30: qty 180, 90d supply, fill #0

## 2023-03-31 ENCOUNTER — Other Ambulatory Visit (HOSPITAL_COMMUNITY): Payer: Self-pay

## 2023-04-02 ENCOUNTER — Other Ambulatory Visit (HOSPITAL_COMMUNITY): Payer: Self-pay

## 2023-04-02 MED ORDER — ATORVASTATIN CALCIUM 40 MG PO TABS
40.0000 mg | ORAL_TABLET | Freq: Every day | ORAL | 0 refills | Status: DC
  Filled 2023-04-02: qty 90, 90d supply, fill #0

## 2023-04-03 ENCOUNTER — Other Ambulatory Visit: Payer: Self-pay

## 2023-04-03 ENCOUNTER — Other Ambulatory Visit (HOSPITAL_COMMUNITY): Payer: Self-pay

## 2023-04-03 MED ORDER — POTASSIUM CHLORIDE CRYS ER 20 MEQ PO TBCR
20.0000 meq | EXTENDED_RELEASE_TABLET | Freq: Every day | ORAL | 3 refills | Status: DC
  Filled 2023-04-03: qty 90, 90d supply, fill #0
  Filled 2023-06-23: qty 90, 90d supply, fill #1
  Filled 2023-10-30: qty 90, 90d supply, fill #2
  Filled 2024-01-17: qty 90, 90d supply, fill #3

## 2023-04-03 MED ORDER — AMLODIPINE BESYLATE 5 MG PO TABS
10.0000 mg | ORAL_TABLET | Freq: Every day | ORAL | 1 refills | Status: DC
  Filled 2023-04-03: qty 180, 90d supply, fill #0
  Filled 2023-06-23: qty 180, 90d supply, fill #1

## 2023-04-03 MED ORDER — ESTRADIOL-NORETHINDRONE ACET 1-0.5 MG PO TABS
1.0000 | ORAL_TABLET | Freq: Every day | ORAL | 1 refills | Status: DC
  Filled 2023-04-03: qty 84, 84d supply, fill #0
  Filled 2023-06-23: qty 84, 84d supply, fill #1

## 2023-04-04 ENCOUNTER — Other Ambulatory Visit (HOSPITAL_COMMUNITY): Payer: Self-pay

## 2023-04-27 ENCOUNTER — Other Ambulatory Visit (HOSPITAL_COMMUNITY): Payer: Self-pay

## 2023-05-08 ENCOUNTER — Other Ambulatory Visit (HOSPITAL_COMMUNITY): Payer: Self-pay

## 2023-05-08 DIAGNOSIS — H18413 Arcus senilis, bilateral: Secondary | ICD-10-CM | POA: Diagnosis not present

## 2023-05-08 DIAGNOSIS — H2511 Age-related nuclear cataract, right eye: Secondary | ICD-10-CM | POA: Diagnosis not present

## 2023-05-08 DIAGNOSIS — Z961 Presence of intraocular lens: Secondary | ICD-10-CM | POA: Diagnosis not present

## 2023-05-08 DIAGNOSIS — H353131 Nonexudative age-related macular degeneration, bilateral, early dry stage: Secondary | ICD-10-CM | POA: Diagnosis not present

## 2023-05-08 MED ORDER — PREDNISOLONE ACETATE 1 % OP SUSP
1.0000 [drp] | Freq: Four times a day (QID) | OPHTHALMIC | 1 refills | Status: AC
  Filled 2023-05-08: qty 5, 25d supply, fill #0
  Filled 2023-08-09 (×2): qty 5, 25d supply, fill #1

## 2023-05-08 MED ORDER — KETOROLAC TROMETHAMINE 0.5 % OP SOLN
1.0000 [drp] | Freq: Four times a day (QID) | OPHTHALMIC | 1 refills | Status: AC
  Filled 2023-05-08: qty 10, 38d supply, fill #0

## 2023-05-08 MED ORDER — GATIFLOXACIN 0.5 % OP SOLN
1.0000 [drp] | Freq: Three times a day (TID) | OPHTHALMIC | 1 refills | Status: AC
  Filled 2023-05-08: qty 7.5, 38d supply, fill #0

## 2023-05-09 ENCOUNTER — Other Ambulatory Visit (HOSPITAL_COMMUNITY): Payer: Self-pay

## 2023-05-10 ENCOUNTER — Other Ambulatory Visit (HOSPITAL_COMMUNITY): Payer: Self-pay

## 2023-05-10 DIAGNOSIS — M545 Low back pain, unspecified: Secondary | ICD-10-CM | POA: Diagnosis not present

## 2023-05-17 DIAGNOSIS — M545 Low back pain, unspecified: Secondary | ICD-10-CM | POA: Diagnosis not present

## 2023-05-24 DIAGNOSIS — M545 Low back pain, unspecified: Secondary | ICD-10-CM | POA: Diagnosis not present

## 2023-05-28 DIAGNOSIS — M545 Low back pain, unspecified: Secondary | ICD-10-CM | POA: Diagnosis not present

## 2023-06-06 ENCOUNTER — Other Ambulatory Visit (HOSPITAL_COMMUNITY): Payer: Self-pay

## 2023-06-06 MED ORDER — BENZONATATE 100 MG PO CAPS
100.0000 mg | ORAL_CAPSULE | Freq: Three times a day (TID) | ORAL | 0 refills | Status: AC
  Filled 2023-06-06: qty 30, 10d supply, fill #0

## 2023-06-12 DIAGNOSIS — D045 Carcinoma in situ of skin of trunk: Secondary | ICD-10-CM | POA: Diagnosis not present

## 2023-06-12 DIAGNOSIS — D0461 Carcinoma in situ of skin of right upper limb, including shoulder: Secondary | ICD-10-CM | POA: Diagnosis not present

## 2023-06-12 DIAGNOSIS — L570 Actinic keratosis: Secondary | ICD-10-CM | POA: Diagnosis not present

## 2023-06-12 DIAGNOSIS — L821 Other seborrheic keratosis: Secondary | ICD-10-CM | POA: Diagnosis not present

## 2023-06-12 DIAGNOSIS — Z85828 Personal history of other malignant neoplasm of skin: Secondary | ICD-10-CM | POA: Diagnosis not present

## 2023-06-12 DIAGNOSIS — L814 Other melanin hyperpigmentation: Secondary | ICD-10-CM | POA: Diagnosis not present

## 2023-06-18 DIAGNOSIS — Z23 Encounter for immunization: Secondary | ICD-10-CM | POA: Diagnosis not present

## 2023-06-20 DIAGNOSIS — M545 Low back pain, unspecified: Secondary | ICD-10-CM | POA: Diagnosis not present

## 2023-06-23 ENCOUNTER — Other Ambulatory Visit (HOSPITAL_COMMUNITY): Payer: Self-pay

## 2023-06-25 ENCOUNTER — Other Ambulatory Visit (HOSPITAL_COMMUNITY): Payer: Self-pay

## 2023-06-25 MED ORDER — ATORVASTATIN CALCIUM 40 MG PO TABS
40.0000 mg | ORAL_TABLET | Freq: Every day | ORAL | 0 refills | Status: DC
  Filled 2023-06-25: qty 90, 90d supply, fill #0

## 2023-06-26 ENCOUNTER — Other Ambulatory Visit (HOSPITAL_COMMUNITY): Payer: Self-pay

## 2023-06-26 MED ORDER — PREDNISONE 10 MG PO TABS
10.0000 mg | ORAL_TABLET | Freq: Four times a day (QID) | ORAL | 0 refills | Status: AC
  Filled 2023-06-26: qty 21, 6d supply, fill #0

## 2023-06-27 DIAGNOSIS — M545 Low back pain, unspecified: Secondary | ICD-10-CM | POA: Diagnosis not present

## 2023-07-27 DIAGNOSIS — H2511 Age-related nuclear cataract, right eye: Secondary | ICD-10-CM | POA: Diagnosis not present

## 2023-08-07 DIAGNOSIS — R2689 Other abnormalities of gait and mobility: Secondary | ICD-10-CM | POA: Diagnosis not present

## 2023-08-07 DIAGNOSIS — I10 Essential (primary) hypertension: Secondary | ICD-10-CM | POA: Diagnosis not present

## 2023-08-07 DIAGNOSIS — E7800 Pure hypercholesterolemia, unspecified: Secondary | ICD-10-CM | POA: Diagnosis not present

## 2023-08-07 DIAGNOSIS — Z6837 Body mass index (BMI) 37.0-37.9, adult: Secondary | ICD-10-CM | POA: Diagnosis not present

## 2023-08-07 DIAGNOSIS — Z23 Encounter for immunization: Secondary | ICD-10-CM | POA: Diagnosis not present

## 2023-08-07 DIAGNOSIS — D649 Anemia, unspecified: Secondary | ICD-10-CM | POA: Diagnosis not present

## 2023-08-07 DIAGNOSIS — Z79899 Other long term (current) drug therapy: Secondary | ICD-10-CM | POA: Diagnosis not present

## 2023-08-07 DIAGNOSIS — G4733 Obstructive sleep apnea (adult) (pediatric): Secondary | ICD-10-CM | POA: Diagnosis not present

## 2023-08-07 DIAGNOSIS — E1169 Type 2 diabetes mellitus with other specified complication: Secondary | ICD-10-CM | POA: Diagnosis not present

## 2023-08-07 DIAGNOSIS — Z1389 Encounter for screening for other disorder: Secondary | ICD-10-CM | POA: Diagnosis not present

## 2023-08-07 DIAGNOSIS — Z0000 Encounter for general adult medical examination without abnormal findings: Secondary | ICD-10-CM | POA: Diagnosis not present

## 2023-08-09 ENCOUNTER — Other Ambulatory Visit: Payer: Self-pay

## 2023-08-09 ENCOUNTER — Other Ambulatory Visit (HOSPITAL_COMMUNITY): Payer: Self-pay

## 2023-08-17 ENCOUNTER — Other Ambulatory Visit (HOSPITAL_COMMUNITY): Payer: Self-pay

## 2023-08-29 DIAGNOSIS — Z23 Encounter for immunization: Secondary | ICD-10-CM | POA: Diagnosis not present

## 2023-09-10 ENCOUNTER — Other Ambulatory Visit (HOSPITAL_COMMUNITY): Payer: Self-pay

## 2023-09-10 MED ORDER — ATORVASTATIN CALCIUM 40 MG PO TABS
40.0000 mg | ORAL_TABLET | Freq: Every day | ORAL | 3 refills | Status: DC
  Filled 2023-09-10: qty 90, 90d supply, fill #0
  Filled 2024-01-17: qty 90, 90d supply, fill #1
  Filled 2024-04-09: qty 90, 90d supply, fill #2
  Filled 2024-07-13: qty 90, 90d supply, fill #3

## 2023-09-10 MED ORDER — ESTRADIOL-NORETHINDRONE ACET 1-0.5 MG PO TABS
1.0000 | ORAL_TABLET | Freq: Every day | ORAL | 3 refills | Status: DC
  Filled 2023-09-10: qty 84, 84d supply, fill #0
  Filled 2024-01-17: qty 84, 84d supply, fill #1
  Filled 2024-04-09: qty 84, 84d supply, fill #2
  Filled 2024-07-13: qty 84, 84d supply, fill #3

## 2023-09-10 MED ORDER — AMLODIPINE BESYLATE 5 MG PO TABS
10.0000 mg | ORAL_TABLET | Freq: Every day | ORAL | 3 refills | Status: DC
  Filled 2023-09-10: qty 180, 90d supply, fill #0
  Filled 2024-01-17: qty 180, 90d supply, fill #1
  Filled 2024-04-09: qty 180, 90d supply, fill #2
  Filled 2024-07-13: qty 180, 90d supply, fill #3

## 2023-09-10 MED ORDER — ACETAZOLAMIDE ER 500 MG PO CP12
ORAL_CAPSULE | ORAL | 3 refills | Status: AC
  Filled 2023-09-10: qty 90, 90d supply, fill #0
  Filled 2023-12-02: qty 90, 90d supply, fill #1
  Filled 2024-03-09: qty 90, 90d supply, fill #2
  Filled 2024-05-26: qty 90, 90d supply, fill #3

## 2023-09-10 MED ORDER — CELECOXIB 200 MG PO CAPS
200.0000 mg | ORAL_CAPSULE | Freq: Two times a day (BID) | ORAL | 1 refills | Status: AC
  Filled 2023-09-10: qty 180, 90d supply, fill #0
  Filled 2023-12-02: qty 180, 90d supply, fill #1

## 2023-09-11 ENCOUNTER — Other Ambulatory Visit (HOSPITAL_COMMUNITY): Payer: Self-pay

## 2023-10-08 DIAGNOSIS — D649 Anemia, unspecified: Secondary | ICD-10-CM | POA: Diagnosis not present

## 2023-10-29 ENCOUNTER — Other Ambulatory Visit (HOSPITAL_COMMUNITY): Payer: Self-pay

## 2023-10-29 DIAGNOSIS — Z961 Presence of intraocular lens: Secondary | ICD-10-CM | POA: Diagnosis not present

## 2023-10-29 DIAGNOSIS — E119 Type 2 diabetes mellitus without complications: Secondary | ICD-10-CM | POA: Diagnosis not present

## 2023-10-29 DIAGNOSIS — H353132 Nonexudative age-related macular degeneration, bilateral, intermediate dry stage: Secondary | ICD-10-CM | POA: Diagnosis not present

## 2023-10-29 DIAGNOSIS — H401131 Primary open-angle glaucoma, bilateral, mild stage: Secondary | ICD-10-CM | POA: Diagnosis not present

## 2023-10-29 DIAGNOSIS — H04123 Dry eye syndrome of bilateral lacrimal glands: Secondary | ICD-10-CM | POA: Diagnosis not present

## 2023-10-29 MED ORDER — CYCLOSPORINE 0.05 % OP EMUL
1.0000 [drp] | Freq: Two times a day (BID) | OPHTHALMIC | 3 refills | Status: AC
  Filled 2023-10-29: qty 180, 90d supply, fill #0

## 2023-10-31 ENCOUNTER — Other Ambulatory Visit (HOSPITAL_COMMUNITY): Payer: Self-pay

## 2023-10-31 MED ORDER — METFORMIN HCL ER 500 MG PO TB24
1000.0000 mg | ORAL_TABLET | Freq: Every day | ORAL | 3 refills | Status: AC
  Filled 2023-10-31: qty 180, 90d supply, fill #0
  Filled 2024-02-03: qty 180, 90d supply, fill #1
  Filled 2024-05-10: qty 180, 90d supply, fill #2

## 2023-12-03 ENCOUNTER — Other Ambulatory Visit (HOSPITAL_COMMUNITY): Payer: Self-pay

## 2024-01-02 ENCOUNTER — Other Ambulatory Visit (HOSPITAL_COMMUNITY): Payer: Self-pay

## 2024-01-17 ENCOUNTER — Other Ambulatory Visit (HOSPITAL_COMMUNITY): Payer: Self-pay

## 2024-01-18 ENCOUNTER — Other Ambulatory Visit (HOSPITAL_COMMUNITY): Payer: Self-pay

## 2024-01-18 MED ORDER — OLMESARTAN MEDOXOMIL-HCTZ 40-25 MG PO TABS
1.0000 | ORAL_TABLET | Freq: Every day | ORAL | 2 refills | Status: DC
  Filled 2024-01-18: qty 90, 90d supply, fill #0
  Filled 2024-04-09: qty 90, 90d supply, fill #1
  Filled 2024-07-13: qty 90, 90d supply, fill #2

## 2024-02-04 ENCOUNTER — Other Ambulatory Visit (HOSPITAL_COMMUNITY): Payer: Self-pay

## 2024-02-07 ENCOUNTER — Other Ambulatory Visit (HOSPITAL_COMMUNITY): Payer: Self-pay

## 2024-02-07 MED ORDER — AMOXICILLIN-POT CLAVULANATE 875-125 MG PO TABS
1.0000 | ORAL_TABLET | Freq: Two times a day (BID) | ORAL | 0 refills | Status: AC
  Filled 2024-02-07: qty 10, 5d supply, fill #0

## 2024-02-07 MED ORDER — FLUCONAZOLE 150 MG PO TABS
ORAL_TABLET | ORAL | 0 refills | Status: AC
  Filled 2024-02-07: qty 2, 4d supply, fill #0

## 2024-02-07 MED ORDER — IPRATROPIUM BROMIDE 0.03 % NA SOLN
2.0000 | Freq: Two times a day (BID) | NASAL | 2 refills | Status: DC
  Filled 2024-02-07: qty 30, 30d supply, fill #0
  Filled 2024-03-20: qty 30, 30d supply, fill #1
  Filled 2024-05-11: qty 30, 30d supply, fill #2

## 2024-03-10 ENCOUNTER — Other Ambulatory Visit (HOSPITAL_COMMUNITY): Payer: Self-pay

## 2024-03-10 ENCOUNTER — Other Ambulatory Visit: Payer: Self-pay

## 2024-03-10 MED ORDER — CELECOXIB 200 MG PO CAPS
200.0000 mg | ORAL_CAPSULE | Freq: Two times a day (BID) | ORAL | 1 refills | Status: AC
  Filled 2024-03-10: qty 180, 90d supply, fill #0
  Filled 2024-05-26: qty 180, 90d supply, fill #1

## 2024-03-11 ENCOUNTER — Other Ambulatory Visit (HOSPITAL_COMMUNITY): Payer: Self-pay

## 2024-04-10 ENCOUNTER — Other Ambulatory Visit (HOSPITAL_COMMUNITY): Payer: Self-pay

## 2024-04-10 MED ORDER — POTASSIUM CHLORIDE CRYS ER 20 MEQ PO TBCR
20.0000 meq | EXTENDED_RELEASE_TABLET | Freq: Every day | ORAL | 3 refills | Status: AC
  Filled 2024-04-10: qty 90, 90d supply, fill #0
  Filled 2024-07-13: qty 90, 90d supply, fill #1
  Filled 2024-11-09: qty 90, 90d supply, fill #2

## 2024-04-28 ENCOUNTER — Other Ambulatory Visit (HOSPITAL_COMMUNITY): Payer: Self-pay

## 2024-04-28 ENCOUNTER — Other Ambulatory Visit: Payer: Self-pay

## 2024-04-28 MED ORDER — ACETAZOLAMIDE ER 500 MG PO CP12
500.0000 mg | ORAL_CAPSULE | Freq: Every day | ORAL | 3 refills | Status: AC
  Filled 2024-04-28 – 2024-09-02 (×2): qty 30, 30d supply, fill #0
  Filled 2024-10-05: qty 30, 30d supply, fill #1
  Filled 2024-11-09: qty 30, 30d supply, fill #2
  Filled 2024-12-05: qty 30, 30d supply, fill #3
  Filled 2025-01-02: qty 30, 30d supply, fill #4

## 2024-05-28 ENCOUNTER — Other Ambulatory Visit (HOSPITAL_COMMUNITY): Payer: Self-pay

## 2024-05-28 ENCOUNTER — Other Ambulatory Visit (HOSPITAL_BASED_OUTPATIENT_CLINIC_OR_DEPARTMENT_OTHER): Payer: Self-pay

## 2024-07-14 ENCOUNTER — Other Ambulatory Visit (HOSPITAL_BASED_OUTPATIENT_CLINIC_OR_DEPARTMENT_OTHER): Payer: Self-pay

## 2024-07-14 ENCOUNTER — Other Ambulatory Visit (HOSPITAL_COMMUNITY): Payer: Self-pay

## 2024-07-14 MED ORDER — IPRATROPIUM BROMIDE 0.03 % NA SOLN
NASAL | 2 refills | Status: DC
  Filled 2024-07-14: qty 30, 30d supply, fill #0
  Filled 2024-09-02: qty 30, 30d supply, fill #1
  Filled 2024-11-02: qty 30, 30d supply, fill #2

## 2024-07-16 ENCOUNTER — Other Ambulatory Visit (HOSPITAL_COMMUNITY): Payer: Self-pay

## 2024-08-05 ENCOUNTER — Other Ambulatory Visit (HOSPITAL_COMMUNITY): Payer: Self-pay

## 2024-08-05 MED ORDER — METFORMIN HCL ER 500 MG PO TB24
1000.0000 mg | ORAL_TABLET | Freq: Every evening | ORAL | 0 refills | Status: DC
  Filled 2024-08-05: qty 119, 60d supply, fill #0
  Filled 2024-08-05: qty 61, 30d supply, fill #0

## 2024-08-06 ENCOUNTER — Other Ambulatory Visit (HOSPITAL_COMMUNITY): Payer: Self-pay

## 2024-09-02 ENCOUNTER — Other Ambulatory Visit (HOSPITAL_COMMUNITY): Payer: Self-pay

## 2024-10-06 ENCOUNTER — Other Ambulatory Visit (HOSPITAL_COMMUNITY): Payer: Self-pay

## 2024-10-06 MED ORDER — AMLODIPINE BESYLATE 5 MG PO TABS
10.0000 mg | ORAL_TABLET | Freq: Every day | ORAL | 3 refills | Status: AC
  Filled 2024-10-06: qty 180, 90d supply, fill #0
  Filled 2024-12-31: qty 180, 90d supply, fill #1

## 2024-10-06 MED ORDER — OLMESARTAN MEDOXOMIL-HCTZ 40-25 MG PO TABS
1.0000 | ORAL_TABLET | Freq: Every day | ORAL | 2 refills | Status: AC
  Filled 2024-10-06: qty 90, 90d supply, fill #0
  Filled 2024-12-31: qty 90, 90d supply, fill #1

## 2024-10-06 MED ORDER — ESTRADIOL-NORETHINDRONE ACET 1-0.5 MG PO TABS
1.0000 | ORAL_TABLET | Freq: Every day | ORAL | 3 refills | Status: AC
  Filled 2024-10-06: qty 84, 84d supply, fill #0

## 2024-10-07 ENCOUNTER — Other Ambulatory Visit (HOSPITAL_COMMUNITY): Payer: Self-pay

## 2024-10-07 MED ORDER — ATORVASTATIN CALCIUM 40 MG PO TABS
40.0000 mg | ORAL_TABLET | Freq: Every day | ORAL | 3 refills | Status: AC
  Filled 2024-10-07: qty 90, 90d supply, fill #0
  Filled 2024-12-31: qty 90, 90d supply, fill #1

## 2024-10-08 ENCOUNTER — Other Ambulatory Visit: Payer: Self-pay

## 2024-10-08 ENCOUNTER — Other Ambulatory Visit (HOSPITAL_COMMUNITY): Payer: Self-pay

## 2024-10-08 MED ORDER — CELECOXIB 200 MG PO CAPS
200.0000 mg | ORAL_CAPSULE | Freq: Two times a day (BID) | ORAL | 1 refills | Status: AC
  Filled 2024-10-08: qty 180, 90d supply, fill #0
  Filled 2024-12-31: qty 180, 90d supply, fill #1

## 2024-10-23 ENCOUNTER — Other Ambulatory Visit (HOSPITAL_COMMUNITY): Payer: Self-pay

## 2024-11-03 ENCOUNTER — Other Ambulatory Visit (HOSPITAL_COMMUNITY): Payer: Self-pay

## 2024-11-03 ENCOUNTER — Other Ambulatory Visit: Payer: Self-pay

## 2024-11-03 MED ORDER — METFORMIN HCL ER 500 MG PO TB24
1000.0000 mg | ORAL_TABLET | Freq: Every evening | ORAL | 0 refills | Status: AC
  Filled 2024-11-03: qty 180, 90d supply, fill #0

## 2024-11-05 ENCOUNTER — Other Ambulatory Visit (HOSPITAL_COMMUNITY): Payer: Self-pay

## 2024-11-12 ENCOUNTER — Other Ambulatory Visit (HOSPITAL_COMMUNITY): Payer: Self-pay

## 2024-12-08 ENCOUNTER — Other Ambulatory Visit (HOSPITAL_COMMUNITY): Payer: Self-pay

## 2024-12-08 ENCOUNTER — Other Ambulatory Visit: Payer: Self-pay

## 2024-12-08 MED ORDER — IPRATROPIUM BROMIDE 0.03 % NA SOLN
2.0000 | Freq: Two times a day (BID) | NASAL | 2 refills | Status: AC
  Filled 2024-12-08: qty 30, 30d supply, fill #0

## 2024-12-31 ENCOUNTER — Other Ambulatory Visit (HOSPITAL_COMMUNITY): Payer: Self-pay

## 2025-01-02 ENCOUNTER — Other Ambulatory Visit (HOSPITAL_COMMUNITY): Payer: Self-pay
